# Patient Record
Sex: Male | Born: 1990 | Race: White | Hispanic: No | Smoking: Never smoker
Health system: Southern US, Community
[De-identification: ages and names within clinical notes are randomized; demographics above are authoritative.]

## PROBLEM LIST (undated history)

## (undated) DIAGNOSIS — F909 Attention-deficit hyperactivity disorder, unspecified type: Secondary | ICD-10-CM

## (undated) HISTORY — DX: Attention-deficit hyperactivity disorder, unspecified type: F90.9

---
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
## 2012-02-25 ENCOUNTER — Institutional Professional Consult (permissible substitution): Payer: BC Managed Care – PPO | Admitting: Family

## 2012-02-25 DIAGNOSIS — F909 Attention-deficit hyperactivity disorder, unspecified type: Secondary | ICD-10-CM

## 2012-05-27 ENCOUNTER — Institutional Professional Consult (permissible substitution) (INDEPENDENT_AMBULATORY_CARE_PROVIDER_SITE_OTHER): Payer: BC Managed Care – PPO | Admitting: Family

## 2012-05-27 DIAGNOSIS — F909 Attention-deficit hyperactivity disorder, unspecified type: Secondary | ICD-10-CM

## 2012-08-31 ENCOUNTER — Institutional Professional Consult (permissible substitution) (INDEPENDENT_AMBULATORY_CARE_PROVIDER_SITE_OTHER): Payer: BC Managed Care – PPO | Admitting: Family

## 2012-08-31 DIAGNOSIS — F909 Attention-deficit hyperactivity disorder, unspecified type: Secondary | ICD-10-CM

## 2012-12-01 ENCOUNTER — Institutional Professional Consult (permissible substitution): Payer: BC Managed Care – PPO | Admitting: Family

## 2012-12-01 DIAGNOSIS — F909 Attention-deficit hyperactivity disorder, unspecified type: Secondary | ICD-10-CM

## 2013-02-24 ENCOUNTER — Institutional Professional Consult (permissible substitution): Payer: BC Managed Care – PPO | Admitting: Family

## 2013-02-24 DIAGNOSIS — F909 Attention-deficit hyperactivity disorder, unspecified type: Secondary | ICD-10-CM

## 2013-11-28 ENCOUNTER — Encounter: Payer: BC Managed Care – PPO | Admitting: Family

## 2013-11-28 DIAGNOSIS — F909 Attention-deficit hyperactivity disorder, unspecified type: Secondary | ICD-10-CM

## 2013-12-15 ENCOUNTER — Institutional Professional Consult (permissible substitution): Payer: BC Managed Care – PPO | Admitting: Family

## 2013-12-15 DIAGNOSIS — F909 Attention-deficit hyperactivity disorder, unspecified type: Secondary | ICD-10-CM

## 2014-03-09 ENCOUNTER — Institutional Professional Consult (permissible substitution): Payer: BC Managed Care – PPO | Admitting: Family

## 2014-03-09 DIAGNOSIS — F909 Attention-deficit hyperactivity disorder, unspecified type: Secondary | ICD-10-CM

## 2014-03-15 ENCOUNTER — Institutional Professional Consult (permissible substitution): Payer: BC Managed Care – PPO | Admitting: Family

## 2014-06-01 ENCOUNTER — Institutional Professional Consult (permissible substitution) (INDEPENDENT_AMBULATORY_CARE_PROVIDER_SITE_OTHER): Payer: BC Managed Care – PPO | Admitting: Family

## 2014-06-01 DIAGNOSIS — F909 Attention-deficit hyperactivity disorder, unspecified type: Secondary | ICD-10-CM

## 2014-08-09 ENCOUNTER — Encounter: Payer: Self-pay | Admitting: Family

## 2014-08-09 ENCOUNTER — Ambulatory Visit (INDEPENDENT_AMBULATORY_CARE_PROVIDER_SITE_OTHER): Payer: BC Managed Care – PPO | Admitting: Family

## 2014-08-09 ENCOUNTER — Telehealth: Payer: Self-pay | Admitting: Family

## 2014-08-09 ENCOUNTER — Other Ambulatory Visit (INDEPENDENT_AMBULATORY_CARE_PROVIDER_SITE_OTHER): Payer: BC Managed Care – PPO

## 2014-08-09 VITALS — BP 118/82 | HR 73 | Temp 98.0°F | Resp 18 | Wt 166.2 lb

## 2014-08-09 DIAGNOSIS — Z0000 Encounter for general adult medical examination without abnormal findings: Secondary | ICD-10-CM

## 2014-08-09 DIAGNOSIS — F909 Attention-deficit hyperactivity disorder, unspecified type: Secondary | ICD-10-CM | POA: Diagnosis not present

## 2014-08-09 LAB — LIPID PANEL
Cholesterol: 196 mg/dL (ref 0–200)
HDL: 48.7 mg/dL (ref >39.00)
LDL Cholesterol: 135 mg/dL — ABNORMAL HIGH (ref 0–99)
NONHDL: 147.3
Total CHOL/HDL Ratio: 4
Triglycerides: 60 mg/dL (ref 0.0–149.0)
VLDL: 12 mg/dL (ref 0.0–40.0)

## 2014-08-09 LAB — CBC
HEMATOCRIT: 50.6 % (ref 39.0–52.0)
Hemoglobin: 17.2 g/dL — ABNORMAL HIGH (ref 13.0–17.0)
MCHC: 34 g/dL (ref 30.0–36.0)
MCV: 91.5 fl (ref 78.0–100.0)
Platelets: 276 10*3/uL (ref 150.0–400.0)
RBC: 5.53 Mil/uL (ref 4.22–5.81)
RDW: 12.6 % (ref 11.5–15.5)
WBC: 4.9 10*3/uL (ref 4.0–10.5)

## 2014-08-09 NOTE — Telephone Encounter (Signed)
Please call the patient to inform him that overall his labs were in the normal range with the exception of his LDL cholesterol. The goal is <100 and his is 138. This can be accomplished first with diet and exercise. Increasing fruits, vegetables and fiber in his diet and reducing some of the red meat. Also obtaining about 30 minutes of exercise most days of the week. He can continue to follow up in a year or sooner if needed.

## 2014-08-09 NOTE — Assessment & Plan Note (Signed)
Appears stable at this time. Ad[facility]. Follow up as needed.

## 2014-08-09 NOTE — Assessment & Plan Note (Signed)
Grossly normal exam. Received flu shot today. Will research Gardasil vaccination. Labs drawn. Will follow up in 1 year unless labs suggest otherwise.

## 2014-08-09 NOTE — Progress Notes (Signed)
Subjective:    Patient ID: Edward Randall, male    DOB: 04/26/1991, 23 y.o.   MRN: [id number]  HPI:  Edward Randall is a 23 y.o. male who presents today to establish care and physical.  1) Health Maintance  Health Maintenance: Dental -- Due for exam  Vision -- Due for exam Immunizations -- Flu shot.   2) ADHD - [facility] He has been off and on his concerta for a while and remains off of it. Indicates that he is feeling fine without it.  No Known Allergies  No current outpatient prescriptions on file prior to visit.   No current facility-administered medications on file prior to visit.   Past Medical History  Diagnosis Date  . ADHD (attention deficit hyperactivity disorder)     Review of Systems General: Denies fever, chills, fatigue, or significant weight gain/loss. Skin: Denies rashes, lumps, itching, dryness, color changes, or hair/nail changes HENT:  Head: Denies headache or neck pain  Ears: Denies changes in hearing, ringing in ears, earache, drainage  Eyes: Denies loss/changes in vision, pain, redness, blurry/double vision, flashing  lights  Nose: Denies discharge, stuffiness, itching, nosebleed, sinus pain  Throat: Denies sore throat, hoarseness, dry mouth, sores, thrush Neck: Denies lumps, swollen glands, stiffness Breasts: Denies lumps, pain, discharge Respiratory: Denies shortness of breath, cough, sputum production, wheezing Cardiovascular: Denies chest pain/discomfort, tightness, palpitations, shortness of breath with activity, difficulty lying down, swelling, sudden awakening with shortness of breath Gastrointestinal: Denies dysphasia, heartburn, change in appetite, nausea, change in bowel habits, rectal bleeding, constipation, diarrhea, yellow skin or eyes Urinary: Denies frequency, urgency, burning/pain, blood in urine, incontinence, change in urinary strength. Vascular: Denies calf pain with walking and leg cramping. Musculoskeletal:  Denies muscle/joint pain, stiffness, back pain, redness or swelling of joints, trauma Neurological: Denies dizziness, fainting, seizures, weakness, numbness, tingling, tremor Hematologic - Denies ease of bruising or bleeding Endocrine - Denies heat or cold intolerance, sweating, frequent urination, excessive thirst, changes in appetite Psychiatric - Denies nervousness, stress, depression or memory loss.       Objective:     BP 118/82  Pulse 73  Temp(Src) 98 F (36.7 C) (Oral)  Resp 18  Wt 166 lb 3.2 oz (75.388 kg)  SpO2 96% Nursing note and vital signs reviewed.  Physical Exam  Constitutional: He is oriented to person, place, and time. He appears well-developed and well-nourished. No distress.  HENT:  Head: Normocephalic.  Right Ear: Hearing, tympanic membrane, external ear and ear canal normal.  Left Ear: Hearing, tympanic membrane, external ear and ear canal normal.  Nose: Nose normal.  Mouth/Throat: Uvula is midline, oropharynx is clear and moist and mucous membranes are normal.  Eyes: Conjunctivae and EOM are normal. Pupils are equal, round, and reactive to light.  Neck: Normal range of motion. Neck supple. No JVD present. No tracheal deviation present. No thyromegaly present.  Cardiovascular: Normal rate, regular rhythm and normal heart sounds.   Pulmonary/Chest: Effort normal and breath sounds normal.  Abdominal: Soft. Bowel sounds are normal. He exhibits no distension and no mass. There is no tenderness. There is no rebound and no guarding.  Musculoskeletal: Normal range of motion.  Lymphadenopathy:    He has no cervical adenopathy.  Neurological: He is alert and oriented to person, place, and time.  Skin: Skin is warm and dry.  Psychiatric: He has a normal mood and affect. His behavior is normal. Judgment and thought content normal.  Assessment & Plan:

## 2014-08-09 NOTE — Progress Notes (Signed)
Pre visit review using our clinic review tool, if applicable. No additional management support is needed unless otherwise documented below in the visit note. 

## 2014-08-09 NOTE — Patient Instructions (Signed)
Thank you for choosing [facility].  Summary/Instructions:   Please stop by the lab before leaving for blood work  Please follow up in a year or sooner depending on the blood work results.  Thank you for enrolling in MyChart. Please follow the instructions below to securely access your online medical record. MyChart allows you to send messages to your doctor, view your test results, renew your prescriptions, schedule appointments, and more.  How Do I Sign Up? 1. In your Internet browser, go to [email or web address]. 2. Click on the New  User? link in the Sign In box.  3. Enter your MyChart Access Code exactly as it appears below. You will not need to use this code after you have completed the sign-up process. If you do not sign up before the expiration date, you must request a new code. MyChart Access Code: [email or web address] Expires: 10/08/2014 10:56 AM  4. Enter the last four digits of your Social Security Number (xxxx) and Date of Birth (mm/dd/yyyy) as indicated and click Next. You will be taken to the next sign-up page. 5. Create a MyChart ID. This will be your MyChart login ID and cannot be changed, so think of one that is secure and easy to remember. 6. Create a MyChart password. You can change your password at any time. 7. Enter your Password Reset Question and Answer and click Next. This can be used at a later time if you forget your password.  8. Select your communication preference, and if applicable enter your e-mail address. You will receive e-mail notification when new information is available in MyChart by choosing to receive e-mail notifications and filling in your e-mail. 9. Click Sign In. You can now view your medical record.   Additional Information If you have questions, you can email [email or web address] or call [phone] to talk to our MyChart staff. Remember, MyChart is NOT to be used for urgent needs. For medical emergencies, dial  911.

## 2014-08-10 NOTE — Telephone Encounter (Signed)
Called pt no answer. Left message

## 2014-08-10 NOTE — Telephone Encounter (Signed)
Spoke to pt to let them know labs are normal with the exception of his cholesterol being a little high. Advised him to eat more fruits, veggies, and fiber and to exercise.

## 2014-08-31 ENCOUNTER — Institutional Professional Consult (permissible substitution) (INDEPENDENT_AMBULATORY_CARE_PROVIDER_SITE_OTHER): Payer: BC Managed Care – PPO | Admitting: Family

## 2014-08-31 DIAGNOSIS — F902 Attention-deficit hyperactivity disorder, combined type: Secondary | ICD-10-CM

## 2014-09-22 ENCOUNTER — Emergency Department (HOSPITAL_COMMUNITY): Payer: BC Managed Care – PPO

## 2014-09-22 ENCOUNTER — Emergency Department (HOSPITAL_COMMUNITY)
Admission: EM | Admit: 2014-09-22 | Discharge: 2014-09-22 | Disposition: A | Discharge status: Home / Self Care | Payer: BC Managed Care – PPO | Attending: Emergency Medicine | Admitting: Emergency Medicine

## 2014-09-22 ENCOUNTER — Encounter (HOSPITAL_COMMUNITY): Payer: Self-pay | Admitting: *Deleted

## 2014-09-22 DIAGNOSIS — S51831A Puncture wound without foreign body of right forearm, initial encounter: Secondary | ICD-10-CM | POA: Insufficient documentation

## 2014-09-22 DIAGNOSIS — Y998 Other external cause status: Secondary | ICD-10-CM | POA: Diagnosis not present

## 2014-09-22 DIAGNOSIS — Y9289 Other specified places as the place of occurrence of the external cause: Secondary | ICD-10-CM | POA: Diagnosis not present

## 2014-09-22 DIAGNOSIS — S51832A Puncture wound without foreign body of left forearm, initial encounter: Secondary | ICD-10-CM | POA: Insufficient documentation

## 2014-09-22 DIAGNOSIS — W540XXA Bitten by dog, initial encounter: Secondary | ICD-10-CM | POA: Diagnosis not present

## 2014-09-22 DIAGNOSIS — F909 Attention-deficit hyperactivity disorder, unspecified type: Secondary | ICD-10-CM | POA: Diagnosis not present

## 2014-09-22 DIAGNOSIS — Z23 Encounter for immunization: Secondary | ICD-10-CM | POA: Insufficient documentation

## 2014-09-22 DIAGNOSIS — Y9389 Activity, other specified: Secondary | ICD-10-CM | POA: Diagnosis not present

## 2014-09-22 MED ORDER — AMOXICILLIN-POT CLAVULANATE 875-125 MG PO TABS: 1.0000 | ORAL_TABLET | Freq: Two times a day (BID) | ORAL | Status: DC

## 2014-09-22 MED ORDER — TETANUS-DIPHTH-ACELL PERTUSSIS 5-2.5-18.5 LF-MCG/0.5 IM SUSP
0.5000 mL | Freq: Once | INTRAMUSCULAR | Status: AC
  Administered 2014-09-22: 0.5 mL via INTRAMUSCULAR
  Filled 2014-09-22: qty 0.5

## 2014-09-22 MED ORDER — HYDROCODONE-ACETAMINOPHEN 5-325 MG PO TABS
2.0000 | ORAL_TABLET | Freq: Once | ORAL | Status: AC
  Administered 2014-09-22: 2 via ORAL
  Filled 2014-09-22: qty 2

## 2014-09-22 MED ORDER — HYDROCODONE-ACETAMINOPHEN 5-325 MG PO TABS: 2.0000 | ORAL_TABLET | ORAL | Status: DC | PRN

## 2014-09-22 MED ORDER — IBUPROFEN 400 MG PO TABS
400.0000 mg | ORAL_TABLET | Freq: Once | ORAL | Status: AC
  Administered 2014-09-22: 400 mg via ORAL
  Filled 2014-09-22: qty 1

## 2014-09-22 MED ORDER — BACITRACIN 500 UNIT/GM EX OINT
1.0000 "application " | TOPICAL_OINTMENT | Freq: Two times a day (BID) | CUTANEOUS | Status: DC
  Filled 2014-09-22 (×17): qty 28.4

## 2014-09-22 MED ORDER — NAPROXEN 500 MG PO TABS: 500.0000 mg | ORAL_TABLET | Freq: Two times a day (BID) | ORAL | Status: DC

## 2014-09-22 NOTE — ED Notes (Signed)
Declined W/C at D/C and was escorted to lobby by RN. 

## 2014-09-22 NOTE — ED Notes (Signed)
Pt in stating that his two dogs got into a fight and he was trying to break it up, multiple puncture wounds to bilateral forearms, bleeding controlled, dressing applied in triage, dogs were up to date on vaccinations

## 2014-09-22 NOTE — Discharge Instructions (Signed)
Animal Bite °An animal bite can result in a scratch on the skin, deep open cut, puncture of the skin, crush injury, or tearing away of the skin or a body part. Dogs are responsible for most animal bites. Children are bitten more often than adults. An animal bite can range from very mild to more serious. A small bite from your house pet is no cause for alarm. However, some animal bites can become infected or injure a bone or other tissue. You must seek medical care if: °· The skin is broken and bleeding does not slow down or stop after 15 minutes. °· The puncture is deep and difficult to clean (such as a cat bite). °· Pain, warmth, redness, or pus develops around the wound. °· The bite is from a stray animal or rodent. There may be a risk of rabies infection. °· The bite is from a snake, raccoon, skunk, fox, coyote, or bat. There may be a risk of rabies infection. °· The person bitten has a chronic illness such as diabetes, liver disease, or cancer, or the person takes medicine that lowers the immune system. °· There is concern about the location and severity of the bite. °It is important to clean and protect an animal bite wound right away to prevent infection. Follow these steps: °· Clean the wound with plenty of water and soap. °· Apply an antibiotic cream. °· Apply gentle pressure over the wound with a clean towel or gauze to slow or stop bleeding. °· Elevate the affected area above the heart to help stop any bleeding. °· Seek medical care. Getting medical care within 8 hours of the animal bite leads to the best possible outcome. °DIAGNOSIS  °Your caregiver will most likely: °· Take a detailed history of the animal and the bite injury. °· Perform a wound exam. °· Take your medical history. °Blood tests or X-rays may be performed. Sometimes, infected bite wounds are cultured and sent to a lab to identify the infectious bacteria.  °TREATMENT  °Medical treatment will depend on the location and type of animal bite as  well as the patient's medical history. Treatment may include: °· Wound care, such as cleaning and flushing the wound with saline solution, bandaging, and elevating the affected area. °· Antibiotics. °· Tetanus immunization. °· Rabies immunization. °· Leaving the wound open to heal. This is often done with animal bites, due to the high risk of infection. However, in certain cases, wound closure with stitches, wound adhesive, skin adhesive strips, or staples may be used. ° Infected bites that are left untreated may require intravenous (IV) antibiotics and surgical treatment in the hospital. °HOME CARE INSTRUCTIONS °· Follow your caregiver's instructions for wound care. °· Take all medicines as directed. °· If your caregiver prescribes antibiotics, take them as directed. Finish them even if you start to feel better. °· Follow up with your caregiver for further exams or immunizations as directed. °You may need a tetanus shot if: °· You cannot remember when you had your last tetanus shot. °· You have never had a tetanus shot. °· The injury broke your skin. °If you get a tetanus shot, your arm may swell, get red, and feel warm to the touch. This is common and not a problem. If you need a tetanus shot and you choose not to have one, there is a rare chance of getting tetanus. Sickness from tetanus can be serious. °SEEK MEDICAL CARE IF: °· You notice warmth, redness, soreness, swelling, pus discharge, or a bad   smell coming from the wound.  You have a red line on the skin coming from the wound.  You have a fever, chills, or a general ill feeling.  You have nausea or vomiting.  You have continued or worsening pain.  You have trouble moving the injured part.  You have other questions or concerns. MAKE SURE YOU:  Understand these instructions.  Will watch your condition.  Will get help right away if you are not doing well or get worse. Document Released: 06/30/2011 Document Revised: 01/04/2012 Document  Reviewed: 06/30/2011 [facility] Patient Information 2015 [employer] [place]. This information is not intended to replace advice given to you by your health care provider. Make sure you discuss any questions you have with your health care provider.  You were evaluated in the ED today for your dog bite. Xray showed no fractures or other abnormalities. Your Tdap was updated in the ED. Take all of your antibiotic, Augmentin, as prescribed. Take Naproxen for pain and inflammation, Vicodin for breakthrough pain. Return to ED if you experience fevers, redness, swelling, numbness or weakness.

## 2014-09-22 NOTE — ED Provider Notes (Signed)
CSN: [id number]     Arrival date & time 09/22/14  1306 History   First MD Initiated Contact with Patient 09/22/14 1535     Chief Complaint  Patient presents with  . Animal Bite     (Consider location/radiation/quality/duration/timing/severity/associated sxs/prior Treatment) HPI Edward Randall is a 23 y.o. male with no synechia past medical history who comes in for evaluation after a dog bite. Patient states his dogs got into a fight this afternoon, he tried to separate them and was bitten by his Doberman pinscher. Patient sustained bites to bilateral forearms. Denies any other injury. Patient reports the dog is his and is up-to-date on his shots. Denies any numbness or weakness at this time. Does not know the date of last tetanus.  Past Medical History  Diagnosis Date  . ADHD (attention deficit hyperactivity disorder)    History reviewed. No pertinent past surgical history. Family History  Problem Relation Age of Onset  . Hypertension Mother   . Diabetes Father    History  Substance Use Topics  . Smoking status: Never Smoker   . Smokeless tobacco: Not on file  . Alcohol Use: 2.4 oz/week    4 Cans of beer per week    Review of Systems  Constitutional: Negative for fever.  HENT: Negative for sore throat.   Eyes: Negative for visual disturbance.  Respiratory: Negative for shortness of breath.   Cardiovascular: Negative for chest pain.  Gastrointestinal: Negative for abdominal pain.  Endocrine: Negative for polyuria.  Genitourinary: Negative for dysuria.  Musculoskeletal: Positive for myalgias.  Skin: Negative for rash.  Neurological: Negative for headaches.      Allergies  Review of patient's allergies indicates no known allergies.  Home Medications   Prior to Admission medications   Medication Sig Start Date End Date Taking? Authorizing Provider  amoxicillin-clavulanate (AUGMENTIN) 875-125 MG per tablet Take 1 tablet by mouth every 12 (twelve) hours. 09/22/14    Sharlene MottsBenjamin W Valentina Alcoser, PA-C  HYDROcodone-acetaminophen (NORCO/VICODIN) 5-325 MG per tablet Take 2 tablets by mouth every 4 (four) hours as needed for moderate pain or severe pain. 09/22/14   Earle GellBenjamin W Ellorie Kindall, PA-C  naproxen (NAPROSYN) 500 MG tablet Take 1 tablet (500 mg total) by mouth 2 (two) times daily. 09/22/14   Earle GellBenjamin W Jalexus Brett, PA-C   BP 132/77 mmHg  Pulse 108  Temp(Src) 98.5 F (36.9 C) (Oral)  Resp 20  Ht 5\' 10"  (1.778 m)  Wt 165 lb (74.844 kg)  BMI 23.68 kg/m2  SpO2 100% Physical Exam  Constitutional: He is oriented to person, place, and time. He appears well-developed and well-nourished.  HENT:  Head: Normocephalic and atraumatic.  Mouth/Throat: Oropharynx is clear and moist.  Eyes: Conjunctivae are normal. Pupils are equal, round, and reactive to light. Right eye exhibits no discharge. Left eye exhibits no discharge. No scleral icterus.  Neck: Neck supple.  Cardiovascular: Normal rate, regular rhythm and normal heart sounds.   Pulmonary/Chest: Effort normal and breath sounds normal. No respiratory distress. He has no wheezes. He has no rales.  Abdominal: Soft. There is no tenderness.  Musculoskeletal: He exhibits no tenderness.  Neurological: He is alert and oriented to person, place, and time.  Cranial Nerves II-XII grossly intact  Skin: Skin is warm and dry. No rash noted.  Patient has multiple mild superficial abrasions to bilateral forearms with several lacerations and puncture wounds. Motor and Sensation of both Upper Extremities 5/5. Grip strength equal and intact bil. Neurovascularly intact. Can flex and extend all digits against  resistance.   Psychiatric: He has a normal mood and affect.  Nursing note and vitals reviewed.   ED Course  Procedures (including critical care time) Labs Review Labs Reviewed - No data to display  Imaging Review Dg Forearm Left  09/22/2014   CLINICAL DATA:  Right forearm dog bite and abrasions today.  EXAM: LEFT FOREARM - 2 VIEW   COMPARISON:  None.  FINDINGS: Dorsal soft tissue swelling and small amount of dorsal soft tissue gas or air. No fracture or radiopaque foreign body.  IMPRESSION: Dorsal soft tissue injury without fracture.   Electronically Signed   By: Gordan PaymentSteve  Reid M.D.   On: 09/22/2014 17:44   Dg Forearm Right  09/22/2014   CLINICAL DATA:  Right forearm pain and abrasions following a dog bite injury today.  EXAM: RIGHT FOREARM - 2 VIEW  COMPARISON:  None.  FINDINGS: Dorsal and ventral soft tissue swelling and gas or air. No fracture or radiopaque foreign body.  IMPRESSION: Soft tissue injury without fracture.   Electronically Signed   By: Gordan PaymentSteve  Reid M.D.   On: 09/22/2014 17:45     EKG Interpretation None      MDM  Vitals stable - WNL -afebrile Pt resting comfortably in ED. declines any pain medicine at this time. PE--not concerning for other acute or emergent pathology. Patient maintains 5/5 motor and sensation bilateral upper extremities. He is able to flex and extend against resistance.  Imaging--x-rays of bilateral forearm showed no foreign bodies, fractures, dislocations or other osseous abnormalities.  Bacitracin dressing applied. Tdap updated Will DC with Augmentin, naproxen and Vicodin for breakthrough pain Discussed f/u with PCP and return precautions, pt very amenable to plan. Patient stable, in good condition and is appropriate for discharge   Final diagnoses:  Dog bite        Sharlene MottsBenjamin W Alexine Pilant, PA-C 09/22/14 2047  Mirian MoMatthew Gentry, MD 09/27/14 1650

## 2015-04-18 ENCOUNTER — Telehealth: Payer: Self-pay | Admitting: Family

## 2015-04-18 NOTE — Telephone Encounter (Signed)
Is requesting compass referral for routine eye exam to Dr. Maple Hudson on [facility] (pediatric opthalmology). Office is requesting referral before they make appt.

## 2015-04-25 NOTE — Telephone Encounter (Signed)
Spoke w/pt. He will get this changed. I will check back with [employer] next week.

## 2015-04-25 NOTE — Telephone Encounter (Signed)
Pt must call [employer] and have PCP changed before we can do a referral. Left message for patient to call office.

## 2015-04-25 NOTE — Telephone Encounter (Signed)
Patients mom to call back with diagnosis code [facility].

## 2015-04-26 NOTE — Telephone Encounter (Signed)
[employer] ref # [id number] valid 04/26/15-10/27/15 for 6 visits. Pt is aware.

## 2015-09-11 ENCOUNTER — Ambulatory Visit (INDEPENDENT_AMBULATORY_CARE_PROVIDER_SITE_OTHER): Payer: 59 | Admitting: Family

## 2015-09-11 ENCOUNTER — Encounter: Payer: Self-pay | Admitting: Family

## 2015-09-11 VITALS — BP 134/72 | HR 98 | Temp 98.3°F | Resp 18 | Ht 70.0 in | Wt 149.8 lb

## 2015-09-11 DIAGNOSIS — F909 Attention-deficit hyperactivity disorder, unspecified type: Secondary | ICD-10-CM

## 2015-09-11 DIAGNOSIS — Z0000 Encounter for general adult medical examination without abnormal findings: Secondary | ICD-10-CM | POA: Diagnosis not present

## 2015-09-11 DIAGNOSIS — Z23 Encounter for immunization: Secondary | ICD-10-CM | POA: Diagnosis not present

## 2015-09-11 MED ORDER — METHYLPHENIDATE HCL ER (LA) 10 MG PO CP24: 10.0000 mg | ORAL_CAPSULE | Freq: Every day | ORAL | Status: DC

## 2015-09-11 NOTE — Assessment & Plan Note (Addendum)
1) Anticipatory Guidance: Discussed importance of wearing a seatbelt while driving and not texting while driving; changing batteries in smoke detector at least once annually; wearing suntan lotion when outside; eating a balanced and moderate diet; getting physical activity at least 30 minutes per day.  2) Immunizations / Screenings / Labs:  Flu shot updated today. All other immunizations are up to date per recommendations. Due for a dental screening which will be scheduled independently. All other screenings are up to date per recommendations.  Obtain CBC, BMET, Lipid profile and TSH.   Overall well exam with minimal risk factors for cardiovascular disease noted. Discussed importance of continued healthy lifestyle choices and decreasing the amount of marijuana and encouraging physical activity. He is of good weight. Follow-up prevention exam in 1 year. Follow-up office visit pending blood work as needed.

## 2015-09-11 NOTE — Progress Notes (Signed)
Pre visit review using our clinic review tool, if applicable. No additional management support is needed unless otherwise documented below in the visit note. 

## 2015-09-11 NOTE — Patient Instructions (Addendum)
Thank you for choosing [facility].  Summary/Instructions:  Your prescription(s) have been submitted to your pharmacy or been printed and provided for you. Please take as directed and contact our office if you believe you are having problem(s) with the medication(s) or have any questions.  Please stop by the lab on the basement level of the building for your blood work. Your results will be released to MyChart (or called to you) after review, usually within 72 hours after test completion. If any changes need to be made, you will be notified at that same time.   Health Maintenance, Male A healthy lifestyle and preventative care can promote health and wellness.  Maintain regular health, dental, and eye exams.  Eat a healthy diet. Foods like vegetables, fruits, whole grains, low-fat dairy products, and lean protein foods contain the nutrients you need and are low in calories. Decrease your intake of foods high in solid fats, added sugars, and salt. Get information about a proper diet from your health care provider, if necessary.  Regular physical exercise is one of the most important things you can do for your health. Most adults should get at least 150 minutes of moderate-intensity exercise (any activity that increases your heart rate and causes you to sweat) each week. In addition, most adults need muscle-strengthening exercises on 2 or more days a week.   Maintain a healthy weight. The body mass index (BMI) is a screening tool to identify possible weight problems. It provides an estimate of body fat based on height and weight. Your health care provider can find your BMI and can help you achieve or maintain a healthy weight. For males 20 years and older:  A BMI below 18.5 is considered underweight.  A BMI of 18.5 to 24.9 is normal.  A BMI of 25 to 29.9 is considered overweight.  A BMI of 30 and above is considered obese.  Maintain normal blood lipids and cholesterol by exercising  and minimizing your intake of saturated fat. Eat a balanced diet with plenty of fruits and vegetables. Blood tests for lipids and cholesterol should begin at age 20 and be repeated every 5 years. If your lipid or cholesterol levels are high, you are over age 50, or you are at high risk for heart disease, you may need your cholesterol levels checked more frequently.Ongoing high lipid and cholesterol levels should be treated with medicines if diet and exercise are not working.  If you smoke, find out from your health care provider how to quit. If you do not use tobacco, do not start.  Lung cancer screening is recommended for adults aged 55-80 years who are at high risk for developing lung cancer because of a history of smoking. A yearly low-dose CT scan of the lungs is recommended for people who have at least a 30-pack-year history of smoking and are current smokers or have quit within the past 15 years. A pack year of smoking is smoking an average of 1 pack of cigarettes a day for 1 year (for example, a 30-pack-year history of smoking could mean smoking 1 pack a day for 30 years or 2 packs a day for 15 years). Yearly screening should continue until the smoker has stopped smoking for at least 15 years. Yearly screening should be stopped for people who develop a health problem that would prevent them from having lung cancer treatment.  If you choose to drink alcohol, do not have more than 2 drinks per day. One drink is considered to   be 12 oz (360 mL) of beer, 5 oz (150 mL) of wine, or 1.5 oz (45 mL) of liquor.  Avoid the use of street drugs. Do not share needles with anyone. Ask for help if you need support or instructions about stopping the use of drugs.  High blood pressure causes heart disease and increases the risk of stroke. High blood pressure is more likely to develop in:  People who have blood pressure in the end of the normal range (100-139/85-89 mm Hg).  People who are overweight or  obese.  People who are African American.  If you are 18-39 years of age, have your blood pressure checked every 3-5 years. If you are 40 years of age or older, have your blood pressure checked every year. You should have your blood pressure measured twice--once when you are at a hospital or clinic, and once when you are not at a hospital or clinic. Record the average of the two measurements. To check your blood pressure when you are not at a hospital or clinic, you can use:  An automated blood pressure machine at a pharmacy.  A home blood pressure monitor.  If you are 45-79 years old, ask your health care provider if you should take aspirin to prevent heart disease.  Diabetes screening involves taking a blood sample to check your fasting blood sugar level. This should be done once every 3 years after age 45 if you are at a normal weight and without risk factors for diabetes. Testing should be considered at a younger age or be carried out more frequently if you are overweight and have at least 1 risk factor for diabetes.  Colorectal cancer can be detected and often prevented. Most routine colorectal cancer screening begins at the age of 50 and continues through age 75. However, your health care provider may recommend screening at an earlier age if you have risk factors for colon cancer. On a yearly basis, your health care provider may provide home test kits to check for hidden blood in the stool. A small camera at the end of a tube may be used to directly examine the colon (sigmoidoscopy or colonoscopy) to detect the earliest forms of colorectal cancer. Talk to your health care provider about this at age 50 when routine screening begins. A direct exam of the colon should be repeated every 5-10 years through age 75, unless early forms of precancerous polyps or small growths are found.  People who are at an increased risk for hepatitis B should be screened for this virus. You are considered at high risk  for hepatitis B if:  You were born in a country where hepatitis B occurs often. Talk with your health care provider about which countries are considered high risk.  Your parents were born in a high-risk country and you have not received a shot to protect against hepatitis B (hepatitis B vaccine).  You have HIV or AIDS.  You use needles to inject street drugs.  You live with, or have sex with, someone who has hepatitis B.  You are a man who has sex with other men (MSM).  You get hemodialysis treatment.  You take certain medicines for conditions like cancer, organ transplantation, and autoimmune conditions.  Hepatitis C blood testing is recommended for all people born from 1945 through 1965 and any individual with known risk factors for hepatitis C.  Healthy men should no longer receive prostate-specific antigen (PSA) blood tests as part of routine cancer screening. Talk to   your health care provider about prostate cancer screening.  Testicular cancer screening is not recommended for adolescents or adult males who have no symptoms. Screening includes self-exam, a health care provider exam, and other screening tests. Consult with your health care provider about any symptoms you have or any concerns you have about testicular cancer.  Practice safe sex. Use condoms and avoid high-risk sexual practices to reduce the spread of sexually transmitted infections (STIs).  You should be screened for STIs, including gonorrhea and chlamydia if:  You are sexually active and are younger than 24 years.  You are older than 24 years, and your health care provider tells you that you are at risk for this type of infection.  Your sexual activity has changed since you were last screened, and you are at an increased risk for chlamydia or gonorrhea. Ask your health care provider if you are at risk.  If you are at risk of being infected with HIV, it is recommended that you take a prescription medicine daily to  prevent HIV infection. This is called pre-exposure prophylaxis (PrEP). You are considered at risk if:  You are a man who has sex with other men (MSM).  You are a heterosexual man who is sexually active with multiple partners.  You take drugs by injection.  You are sexually active with a partner who has HIV.  Talk with your health care provider about whether you are at high risk of being infected with HIV. If you choose to begin PrEP, you should first be tested for HIV. You should then be tested every 3 months for as long as you are taking PrEP.  Use sunscreen. Apply sunscreen liberally and repeatedly throughout the day. You should seek shade when your shadow is shorter than you. Protect yourself by wearing long sleeves, pants, a wide-brimmed hat, and sunglasses year round whenever you are outdoors.  Tell your health care provider of new moles or changes in moles, especially if there is a change in shape or color. Also, tell your health care provider if a mole is larger than the size of a pencil eraser.  A one-time screening for abdominal aortic aneurysm (AAA) and surgical repair of large AAAs by ultrasound is recommended for men aged 65-75 years who are current or former smokers.  Stay current with your vaccines (immunizations).   This information is not intended to replace advice given to you by your health care provider. Make sure you discuss any questions you have with your health care provider.   Document Released: 04/09/2008 Document Revised: 11/02/2014 Document Reviewed: 03/09/2011 Elsevier Interactive Patient Education 2016 Elsevier Inc.  

## 2015-09-11 NOTE — Assessment & Plan Note (Signed)
Has been off of the methylphenidate for over 1 year secondary to concern for increased anxiety. Decrease methylphenidate to 10 mg daily. Discussed importance of maintaining medication regimen and if anxiety increases once again, may have to try alternative medications including Strattera or bupropion. [place] controlled substance database reviewed with no irregularities. If chooses to continue, obtain urine drug screen and controlled substance contract.

## 2015-09-11 NOTE — Progress Notes (Signed)
Subjective:    Patient ID: Edward Randall, male    DOB: 21-Jul-1991, 24 y.o.   MRN: [id number]  Chief Complaint  Patient presents with  . CPE    not fasting    HPI:  Edward Randall is a 24 y.o. male who presents today for an annual wellness visit.   1) Health Maintenance -   Diet - Averages about 2-3 meals per day consisting of fruits,vegetables, occasional fish, beef and pork. 1-2 cups of caffeine daily.   Exercise - Physical labor at work - not structured exercise.    2) Preventative Exams / Immunizations:  Dental -- Due for an exam   Vision -- Up to date   Health Maintenance  Topic Date Due  . HIV Screening  03/30/2008  . INFLUENZA VACCINE  05/27/2015  . TETANUS/TDAP  09/22/2024    Immunization History  Administered Date(s) Administered  . Influenza,inj,Quad PF,36+ Mos 09/11/2015  . Tdap 09/22/2014     No Known Allergies   Outpatient Prescriptions Prior to Visit  Medication Sig Dispense Refill  . amoxicillin-clavulanate (AUGMENTIN) 875-125 MG per tablet Take 1 tablet by mouth every 12 (twelve) hours. 14 tablet 0  . HYDROcodone-acetaminophen (NORCO/VICODIN) 5-325 MG per tablet Take 2 tablets by mouth every 4 (four) hours as needed for moderate pain or severe pain. 10 tablet 0  . naproxen (NAPROSYN) 500 MG tablet Take 1 tablet (500 mg total) by mouth 2 (two) times daily. 30 tablet 0   No facility-administered medications prior to visit.     Past Medical History  Diagnosis Date  . ADHD (attention deficit hyperactivity disorder)      History reviewed. No pertinent past surgical history.   Family History  Problem Relation Age of Onset  . Hypertension Mother   . Diabetes Father      Social History   Social History  . Marital Status: Single    Spouse Name: N/A  . Number of Children: 0  . Years of Education: 14   Occupational History  . 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 Smoking status: Never Smoker   .  Smokeless tobacco: Never Used  . Alcohol Use: 2.4 oz/week    4 Cans of beer per week  . Drug Use: 21.00 per week    Special: Marijuana  . Sexual Activity: Yes    Birth Control/ Protection: None   Other Topics Concern  . Not on file   Social History Narrative   Fun: Watch tv, play video games, play the drums   Diet: Eats a lot of meat, 2-3 meals per day; eating more vegetables   Exercise: not any intentional exercise out of work or playing the drums.   Denies any religious beliefs that would effect health care.   Lives in a house with his dad.     Review of Systems  Constitutional: Denies fever, chills, fatigue, or significant weight gain/loss. HENT: Head: Denies headache or neck pain Ears: Denies changes in hearing, ringing in ears, earache, drainage Nose: Denies discharge, stuffiness, itching, nosebleed, sinus pain Throat: Denies sore throat, hoarseness, dry mouth, sores, thrush Eyes: Denies loss/changes in vision, pain, redness, blurry/double vision, flashing lights Cardiovascular: Denies chest pain/discomfort, tightness, palpitations, shortness of breath with activity, difficulty lying down, swelling, sudden awakening with shortness of breath Respiratory: Denies shortness of breath, cough, sputum production, wheezing Gastrointestinal: Denies dysphasia, heartburn, change in appetite, nausea, change in bowel habits, rectal bleeding, constipation, diarrhea,  yellow skin or eyes Genitourinary: Denies frequency, urgency, burning/pain, blood in urine, incontinence, change in urinary strength. Musculoskeletal: Denies muscle/joint pain, stiffness, back pain, redness or swelling of joints, trauma Skin: Denies rashes, lumps, itching, dryness, color changes, or hair/nail changes Neurological: Denies dizziness, fainting, seizures, weakness, numbness, tingling, tremor Psychiatric - Denies nervousness, stress, depression or memory loss Endocrine: Denies heat or cold intolerance, sweating,  frequent urination, excessive thirst, changes in appetite Hematologic: Denies ease of bruising or bleeding     Objective:     BP 134/72 mmHg  Pulse 98  Temp(Src) 98.3 F (36.8 C) (Oral)  Resp 18  Ht 5\' 10"  (1.778 m)  Wt 149 lb 12.8 oz (67.949 kg)  BMI 21.49 kg/m2  SpO2 95% Nursing note and vital signs reviewed.  Physical Exam  Constitutional: He is oriented to person, place, and time. He appears well-developed and well-nourished.  HENT:  Head: Normocephalic.  Right Ear: Hearing, tympanic membrane, external ear and ear canal normal.  Left Ear: Hearing, tympanic membrane, external ear and ear canal normal.  Nose: Nose normal.  Mouth/Throat: Uvula is midline, oropharynx is clear and moist and mucous membranes are normal.  Eyes: Conjunctivae and EOM are normal. Pupils are equal, round, and reactive to light.  Neck: Neck supple. No JVD present. No tracheal deviation present. No thyromegaly present.  Cardiovascular: Normal rate, regular rhythm, normal heart sounds and intact distal pulses.   Pulmonary/Chest: Effort normal and breath sounds normal.  Abdominal: Soft. Bowel sounds are normal. He exhibits no distension and no mass. There is no tenderness. There is no rebound and no guarding.  Musculoskeletal: Normal range of motion. He exhibits no edema or tenderness.  Lymphadenopathy:    He has no cervical adenopathy.  Neurological: He is alert and oriented to person, place, and time. He has normal reflexes. No cranial nerve deficit. He exhibits normal muscle tone. Coordination normal.  Skin: Skin is warm and dry.  Psychiatric: He has a normal mood and affect. His behavior is normal. Judgment and thought content normal.       Assessment & Plan:   Problem List Items Addressed This Visit      Other   Routine general medical examination at a health care facility - Primary    1) Anticipatory Guidance: Discussed importance of wearing a seatbelt while driving and not texting while  driving; changing batteries in smoke detector at least once annually; wearing suntan lotion when outside; eating a balanced and moderate diet; getting physical activity at least 30 minutes per day.  2) Immunizations / Screenings / Labs:  Flu shot updated today. All other immunizations are up to date per recommendations. Due for a dental screening which will be scheduled independently. All other screenings are up to date per recommendations.  Obtain CBC, BMET, Lipid profile and TSH.   Overall well exam with minimal risk factors for cardiovascular disease noted. Discussed importance of continued healthy lifestyle choices and decreasing the amount of marijuana and encouraging physical activity. He is of good weight. Follow-up prevention exam in 1 year. Follow-up office visit pending blood work as needed.      Relevant Orders   CBC   Comprehensive metabolic panel   Lipid panel   TSH   ADHD (attention deficit hyperactivity disorder)    Has been off of the methylphenidate for over 1 year secondary to concern for increased anxiety. Decrease methylphenidate to 10 mg daily. Discussed importance of maintaining medication regimen and if anxiety increases once again, may have to  try alternative medications including Strattera or bupropion. [place] controlled substance database reviewed with no irregularities. If chooses to continue, obtain urine drug screen and controlled substance contract.      Relevant Medications   methylphenidate (RITALIN LA) 10 MG 24 hr capsule    Other Visit Diagnoses    Encounter for immunization

## 2015-09-16 ENCOUNTER — Telehealth: Payer: Self-pay | Admitting: Family

## 2015-09-16 NOTE — Telephone Encounter (Signed)
Please advise 

## 2015-09-16 NOTE — Telephone Encounter (Signed)
Pt called stated that methylphenidate (RITALIN LA) 10 MG is too much trouble to get due to insurance, pt was wondering if Tammy SoursGreg can go a head and give antidepressant (the one that does not need the PA and insurance also cover). Please help

## 2015-09-17 ENCOUNTER — Telehealth: Payer: Self-pay

## 2015-09-17 MED ORDER — BUPROPION HCL ER (SR) 150 MG PO TB12: ORAL_TABLET | ORAL | Status: DC

## 2015-09-17 NOTE — Telephone Encounter (Signed)
Medication sent to pharmacy  

## 2015-09-17 NOTE — Telephone Encounter (Signed)
erx done

## 2015-09-17 NOTE — Telephone Encounter (Signed)
Pt grandmother called to advise that the pharmacy we used no longer accepts their insurance. Requests that we send to [facility]  Address: [address], [place], [place] [address] [phone]

## 2015-09-17 NOTE — Telephone Encounter (Signed)
PA initiated via covermymeds. ZOX:[id number]

## 2015-09-23 ENCOUNTER — Other Ambulatory Visit (INDEPENDENT_AMBULATORY_CARE_PROVIDER_SITE_OTHER): Payer: 59

## 2015-09-23 ENCOUNTER — Telehealth: Payer: Self-pay | Admitting: Family

## 2015-09-23 DIAGNOSIS — Z0000 Encounter for general adult medical examination without abnormal findings: Secondary | ICD-10-CM | POA: Diagnosis not present

## 2015-09-23 LAB — CBC
HEMATOCRIT: 49.6 % (ref 39.0–52.0)
HEMOGLOBIN: 16.7 g/dL (ref 13.0–17.0)
MCHC: 33.6 g/dL (ref 30.0–36.0)
MCV: 92.5 fl (ref 78.0–100.0)
PLATELETS: 265 10*3/uL (ref 150.0–400.0)
RBC: 5.37 Mil/uL (ref 4.22–5.81)
RDW: 12.7 % (ref 11.5–15.5)
WBC: 5.8 10*3/uL (ref 4.0–10.5)

## 2015-09-23 LAB — COMPREHENSIVE METABOLIC PANEL
ALBUMIN: 4.8 g/dL (ref 3.5–5.2)
ALK PHOS: 64 U/L (ref 39–117)
ALT: 12 U/L (ref 0–53)
AST: 12 U/L (ref 0–37)
BUN: 19 mg/dL (ref 6–23)
CO2: 27 mEq/L (ref 19–32)
Calcium: 10.2 mg/dL (ref 8.4–10.5)
Chloride: 102 mEq/L (ref 96–112)
Creatinine, Ser: 0.99 mg/dL (ref 0.40–1.50)
GFR: 100.03 mL/min (ref >60.00)
Glucose, Bld: 97 mg/dL (ref 70–99)
POTASSIUM: 4.4 meq/L (ref 3.5–5.1)
Sodium: 139 mEq/L (ref 135–145)
Total Bilirubin: 0.8 mg/dL (ref 0.2–1.2)
Total Protein: 7.6 g/dL (ref 6.0–8.3)

## 2015-09-23 LAB — LIPID PANEL
Cholesterol: 137 mg/dL (ref 0–200)
HDL: 45 mg/dL (ref >39.00)
LDL Cholesterol: 74 mg/dL (ref 0–99)
NonHDL: 92.25
Total CHOL/HDL Ratio: 3
Triglycerides: 93 mg/dL (ref 0.0–149.0)
VLDL: 18.6 mg/dL (ref 0.0–40.0)

## 2015-09-23 LAB — TSH: TSH: 1.38 u[IU]/mL (ref 0.35–4.50)

## 2015-09-23 NOTE — Telephone Encounter (Signed)
Please inform patient that his blood work shows that his kidney function, liver function, electrolytes, thyroid function, white/red blood cells and cholesterol are within the normal limits. Therefore no further action is required at this time and he can plan to follow up in 1 year.   

## 2015-09-25 NOTE — Telephone Encounter (Signed)
LVM letting pt know.  

## 2015-10-11 ENCOUNTER — Ambulatory Visit (INDEPENDENT_AMBULATORY_CARE_PROVIDER_SITE_OTHER): Payer: 59 | Admitting: Family

## 2015-10-11 ENCOUNTER — Encounter: Payer: Self-pay | Admitting: Family

## 2015-10-11 VITALS — BP 148/80 | HR 83 | Temp 97.8°F | Resp 18 | Ht 70.0 in | Wt 147.0 lb

## 2015-10-11 DIAGNOSIS — F909 Attention-deficit hyperactivity disorder, unspecified type: Secondary | ICD-10-CM

## 2015-10-11 MED ORDER — BUPROPION HCL ER (SR) 150 MG PO TB12: 150.0000 mg | ORAL_TABLET | Freq: Two times a day (BID) | ORAL | Status: DC

## 2015-10-11 NOTE — Assessment & Plan Note (Signed)
ADHD is stable with current regimen of Wellbutrin. Denies adverse side effects. Attention is well controlled. Continue current dosage of Wellbutrin. Follow-up in 3 months.

## 2015-10-11 NOTE — Patient Instructions (Signed)
Thank you for choosing [facility].  Summary/Instructions:  Please continue to take your medication as prescribed.   Your prescription(s) have been submitted to your pharmacy or been printed and provided for you. Please take as directed and contact our office if you believe you are having problem(s) with the medication(s) or have any questions.  If your symptoms worsen or fail to improve, please contact our office for further instruction, or in case of emergency go directly to the emergency room at the closest medical facility.     

## 2015-10-11 NOTE — Progress Notes (Signed)
   Subjective:    Patient ID: Edward Randall, male    DOB: 06/14/1991, 24 y.o.   MRN: [id number]  Chief Complaint  Patient presents with  . Follow-up    does not take ritalin anymore, would like refill of wellbutrin    HPI:  Edward Randall is a 24 y.o. male who  has a past medical history of ADHD (attention deficit hyperactivity disorder). and presents today for a follow up office visit.   1.) ADHD - Currently maintained on wellbutrin. Takes the medication as prescribed and notes that his attention is stable with the current dose. Averaging about 6-7 hours of sleep.   No Known Allergies   No current outpatient prescriptions on file prior to visit.   No current facility-administered medications on file prior to visit.    Review of Systems  Constitutional: Negative for diaphoresis, appetite change and unexpected weight change.  Respiratory: Negative for chest tightness and shortness of breath.   Cardiovascular: Negative for chest pain, palpitations and leg swelling.  Neurological: Negative for headaches.  Psychiatric/Behavioral: Negative for sleep disturbance and decreased concentration. The patient is not nervous/anxious.       Objective:    BP 148/80 mmHg  Pulse 83  Temp(Src) 97.8 F (36.6 C) (Oral)  Resp 18  Ht 5\' 10"  (1.778 m)  Wt 147 lb (66.679 kg)  BMI 21.09 kg/m2  SpO2 98% Nursing note and vital signs reviewed.  Physical Exam  Constitutional: He is oriented to person, place, and time. He appears well-developed and well-nourished. No distress.  Cardiovascular: Normal rate, regular rhythm, normal heart sounds and intact distal pulses.   Pulmonary/Chest: Effort normal and breath sounds normal.  Neurological: He is alert and oriented to person, place, and time.  Skin: Skin is warm and dry.  Psychiatric: He has a normal mood and affect. His behavior is normal. Judgment and thought content normal.       Assessment & Plan:   Problem List Items Addressed This Visit       Other   ADHD (attention deficit hyperactivity disorder) - Primary    ADHD is stable with current regimen of Wellbutrin. Denies adverse side effects. Attention is well controlled. Continue current dosage of Wellbutrin. Follow-up in 3 months.      Relevant Medications   buPROPion (WELLBUTRIN SR) 150 MG 12 hr tablet

## 2015-10-11 NOTE — Progress Notes (Signed)
Pre visit review using our clinic review tool, if applicable. No additional management support is needed unless otherwise documented below in the visit note. 

## 2015-12-25 ENCOUNTER — Encounter: Payer: Self-pay | Admitting: Family

## 2015-12-25 ENCOUNTER — Ambulatory Visit (INDEPENDENT_AMBULATORY_CARE_PROVIDER_SITE_OTHER): Payer: BLUE CROSS/BLUE SHIELD | Admitting: Family

## 2015-12-25 VITALS — BP 116/70 | HR 76 | Temp 98.1°F | Resp 14 | Ht 70.0 in | Wt 142.4 lb

## 2015-12-25 DIAGNOSIS — F909 Attention-deficit hyperactivity disorder, unspecified type: Secondary | ICD-10-CM

## 2015-12-25 MED ORDER — BUPROPION HCL ER (SR) 150 MG PO TB12: 150.0000 mg | ORAL_TABLET | Freq: Two times a day (BID) | ORAL | Status: DC

## 2015-12-25 NOTE — Patient Instructions (Signed)
Thank you for choosing [facility].  Summary/Instructions:  Continue to take the medication as prescribed.   Your prescription(s) have been submitted to your pharmacy or been printed and provided for you. Please take as directed and contact our office if you believe you are having problem(s) with the medication(s) or have any questions.  If your symptoms worsen or fail to improve, please contact our office for further instruction, or in case of emergency go directly to the emergency room at the closest medical facility.

## 2015-12-25 NOTE — Assessment & Plan Note (Signed)
ADHD symptoms well controlled with current regimen and denies adverse side effects. Continue current dosage of Wellbutrin. Follow-up in 6 months or sooner if needed.

## 2015-12-25 NOTE — Progress Notes (Signed)
Pre visit review using our clinic review tool, if applicable. No additional management support is needed unless otherwise documented below in the visit note. 

## 2015-12-25 NOTE — Progress Notes (Signed)
   Subjective:    Patient ID: Edward Randall, male    DOB: 1991/02/04, 25 y.o.   MRN: [id number]  Chief Complaint  Patient presents with  . Medication Management    med refill    HPI:  Edward Randall is a 25 y.o. male who  has a past medical history of ADHD (attention deficit hyperactivity disorder). and presents today for a follow up office visit.   1.) ADHD - Currently maintained on Wellbutrin. Reports taking the medication as prescribed and denies adverse side effects. Notes that his attention is adequately controlled with current regimen. Reports that he sleeping well and has no significant changes in appetite or weight loss.   No Known Allergies   Outpatient Prescriptions Prior to Visit  Medication Sig Dispense Refill  . buPROPion (WELLBUTRIN SR) 150 MG 12 hr tablet Take 1 tablet (150 mg total) by mouth 2 (two) times daily. 60 tablet 2   No facility-administered medications prior to visit.     Review of Systems  Constitutional: Negative for fever, chills and unexpected weight change.  Psychiatric/Behavioral: Negative for sleep disturbance and decreased concentration.      Objective:    BP 116/70 mmHg  Pulse 76  Temp(Src) 98.1 F (36.7 C) (Oral)  Resp 14  Ht  (1.778 m)  Wt 142 lb 6.4 oz (64.592 kg)  BMI 20.43 kg/m2  SpO2 97% Nursing note and vital signs reviewed.  Physical Exam  Constitutional: He is oriented to person, place, and time. He appears well-developed and well-nourished. No distress.  Cardiovascular: Normal rate, regular rhythm, normal heart sounds and intact distal pulses.   Pulmonary/Chest: Effort normal and breath sounds normal.  Neurological: He is alert and oriented to person, place, and time.  Skin: Skin is warm and dry.  Psychiatric: He has a normal mood and affect. His behavior is normal. Judgment and thought content normal.       Assessment & Plan:   Problem List Items Addressed This Visit      Other   ADHD (attention deficit  hyperactivity disorder) - Primary    ADHD symptoms well controlled with current regimen and denies adverse side effects. Continue current dosage of Wellbutrin. Follow-up in 6 months or sooner if needed.      Relevant Medications   buPROPion (WELLBUTRIN SR) 150 MG 12 hr tablet

## 2016-02-06 DIAGNOSIS — S39011A Strain of muscle, fascia and tendon of abdomen, initial encounter: Secondary | ICD-10-CM | POA: Diagnosis not present

## 2016-04-02 ENCOUNTER — Other Ambulatory Visit: Payer: Self-pay | Admitting: Family

## 2016-09-04 ENCOUNTER — Other Ambulatory Visit: Payer: Self-pay | Admitting: Family

## 2016-09-11 DIAGNOSIS — S92301A Fracture of unspecified metatarsal bone(s), right foot, initial encounter for closed fracture: Secondary | ICD-10-CM | POA: Diagnosis not present

## 2016-09-12 DIAGNOSIS — S92354A Nondisplaced fracture of fifth metatarsal bone, right foot, initial encounter for closed fracture: Secondary | ICD-10-CM | POA: Diagnosis not present

## 2017-04-07 ENCOUNTER — Other Ambulatory Visit: Payer: Self-pay | Admitting: Family

## 2017-04-09 ENCOUNTER — Emergency Department (HOSPITAL_COMMUNITY): Payer: BLUE CROSS/BLUE SHIELD

## 2017-04-09 ENCOUNTER — Encounter (HOSPITAL_COMMUNITY): Payer: Self-pay | Admitting: *Deleted

## 2017-04-09 ENCOUNTER — Emergency Department (HOSPITAL_COMMUNITY)
Admission: EM | Admit: 2017-04-09 | Discharge: 2017-04-09 | Disposition: A | Discharge status: Home / Self Care | Payer: BLUE CROSS/BLUE SHIELD | Attending: Emergency Medicine | Admitting: Emergency Medicine

## 2017-04-09 DIAGNOSIS — R0989 Other specified symptoms and signs involving the circulatory and respiratory systems: Secondary | ICD-10-CM | POA: Diagnosis not present

## 2017-04-09 DIAGNOSIS — H8111 Benign paroxysmal vertigo, right ear: Secondary | ICD-10-CM | POA: Diagnosis not present

## 2017-04-09 DIAGNOSIS — R42 Dizziness and giddiness: Secondary | ICD-10-CM | POA: Diagnosis not present

## 2017-04-09 DIAGNOSIS — H8110 Benign paroxysmal vertigo, unspecified ear: Secondary | ICD-10-CM | POA: Diagnosis not present

## 2017-04-09 DIAGNOSIS — H8149 Vertigo of central origin, unspecified ear: Secondary | ICD-10-CM | POA: Diagnosis not present

## 2017-04-09 LAB — CBC WITH DIFFERENTIAL/PLATELET
Basophils Absolute: 0 10*3/uL (ref 0.0–0.1)
Basophils Relative: 0 %
Eosinophils Absolute: 0.2 10*3/uL (ref 0.0–0.7)
Eosinophils Relative: 2 %
HEMATOCRIT: 44.6 % (ref 39.0–52.0)
HEMOGLOBIN: 15.3 g/dL (ref 13.0–17.0)
Lymphocytes Relative: 35 %
Lymphs Abs: 2.5 10*3/uL (ref 0.7–4.0)
MCH: 31.3 pg (ref 26.0–34.0)
MCHC: 34.3 g/dL (ref 30.0–36.0)
MCV: 91.2 fL (ref 78.0–100.0)
Monocytes Absolute: 0.4 10*3/uL (ref 0.1–1.0)
Monocytes Relative: 6 %
NEUTROS ABS: 4 10*3/uL (ref 1.7–7.7)
NEUTROS PCT: 57 %
Platelets: 239 10*3/uL (ref 150–400)
RBC: 4.89 MIL/uL (ref 4.22–5.81)
RDW: 12.1 % (ref 11.5–15.5)
WBC: 7.1 10*3/uL (ref 4.0–10.5)

## 2017-04-09 LAB — BASIC METABOLIC PANEL
Anion gap: 8 (ref 5–15)
BUN: 16 mg/dL (ref 6–20)
CALCIUM: 9.7 mg/dL (ref 8.9–10.3)
CHLORIDE: 104 mmol/L (ref 101–111)
CO2: 27 mmol/L (ref 22–32)
Creatinine, Ser: 0.88 mg/dL (ref 0.61–1.24)
GFR calc non Af Amer: >60 mL/min (ref >60)
GLUCOSE: 98 mg/dL (ref 65–99)
POTASSIUM: 4.2 mmol/L (ref 3.5–5.1)
Sodium: 139 mmol/L (ref 135–145)

## 2017-04-09 MED ORDER — IOPAMIDOL (ISOVUE-370) INJECTION 76%
INTRAVENOUS | Status: AC
  Administered 2017-04-09: 50 mL
  Filled 2017-04-09: qty 50

## 2017-04-09 NOTE — ED Provider Notes (Signed)
[facility]-EMERGENCY DEPT Provider Note   CSN: [id number] Arrival date & time: 04/09/17  1614     History   Chief Complaint Chief Complaint  Patient presents with  . Dizziness    HPI Edward Randall is a 26 y.o. male.  HPI 26 year old male with no prior past medical history presents to the ED after being evaluated at urgent care for vertiginous symptoms. Patient reports that his symptoms began this morning. This is a new symptom for him. Reports several episodes, occurring since initial onset. Reports that his symptoms are exacerbated with position changes and head movement. No other alleviating or aggravating factors. He denied any recent fevers, illnesses, infections. Patient denied any other physical complaints.   He was seen at urgent care and given Antivert which completely resolved his symptoms. During that time patient had an ultrasound of his neck which revealed a left carotid artery defect most consistent with artifact but given his symptoms was suspicious for dissection flap. Patient was then referred to the emergency department for further workup.  Currently patient is complaining of right-sided neck pain which began several hours after the vertiginous symptoms began. Neck pain is exacerbated with palpation of the right para spinal muscles and neck movement. Denies any left-sided neck pain.  Past Medical History:  Diagnosis Date  . ADHD (attention deficit hyperactivity disorder)     Patient Active Problem List   Diagnosis Date Noted  . Routine general medical examination at a health care facility 08/09/2014  . ADHD (attention deficit hyperactivity disorder) 08/09/2014    History reviewed. No pertinent surgical history.     Home Medications    Prior to Admission medications   Medication Sig Start Date End Date Taking? Authorizing Provider  meclizine (ANTIVERT) 25 MG tablet Take 25 mg by mouth 3 (three) times daily as needed for dizziness.   Yes [provider]   buPROPion (WELLBUTRIN SR) 150 MG 12 hr tablet TAKE ONE TABLET BY MOUTH TWICE DAILY Patient not taking: Reported on 04/09/2017 09/04/16   Veryl Speak, FNP    Family History Family History  Problem Relation Age of Onset  . Hypertension Mother   . Bipolar disorder Mother   . Diabetes Father   . Colon cancer Maternal Grandfather   . Fibromyalgia Paternal Grandmother   . Prostate cancer Paternal Grandfather     Social History Social History  Substance Use Topics  . Smoking status: Never Smoker  . Smokeless tobacco: Never Used  . Alcohol use 2.4 oz/week    4 Cans of beer per week     Allergies   Patient has no known allergies.   Review of Systems Review of Systems All other systems are reviewed and are negative for acute change except as noted in the HPI   Physical Exam Updated Vital Signs BP 134/84   Pulse 79   Temp 98.2 F (36.8 C)   Resp 19   Ht 5\' 10"  (1.778 m)   Wt 63.2 kg (139 lb 6 oz)   SpO2 100%   BMI 20.00 kg/m   Physical Exam  Constitutional: He is oriented to person, place, and time. He appears well-developed and well-nourished. No distress.  HENT:  Head: Normocephalic and atraumatic.  Nose: Nose normal.  Eyes: Conjunctivae and EOM are normal. Pupils are equal, round, and reactive to light. Right eye exhibits no discharge. Left eye exhibits no discharge. No scleral icterus.  Neck: Normal range of motion. Neck supple. Normal carotid pulses and no JVD present. Carotid  bruit is not present.  Cardiovascular: Normal rate and regular rhythm.  Exam reveals no gallop and no friction rub.   No murmur heard. Pulmonary/Chest: Effort normal and breath sounds normal. No stridor. No respiratory distress. He has no rales.  Abdominal: Soft. He exhibits no distension. There is no tenderness.  Musculoskeletal: He exhibits no edema or tenderness.  Neurological: He is alert and oriented to person, place, and time.  Mental Status: Alert and oriented to person,  place, and time. Attention and concentration normal. Speech clear. Recent memory is intact  Cranial Nerves  II Visual Fields: Intact to confrontation. Visual fields intact. III, IV, VI: Pupils equal and reactive to light and near. Full eye movement with lateral nystagmus  V Facial Sensation: Normal. No weakness of masticatory muscles  VII: No facial weakness or asymmetry  VIII Auditory Acuity: Grossly normal  IX/X: The uvula is midline; the palate elevates symmetrically  XI: Normal sternocleidomastoid and trapezius strength  XII: The tongue is midline. No atrophy or fasciculations.   Motor System: Muscle Strength: 5/5 and symmetric in the upper and lower extremities. No pronation or drift.  Muscle Tone: Tone and muscle bulk are normal in the upper and lower extremities.   Reflexes: DTRs: 1+ and symmetrical in all four extremities. Plantar responses are flexor bilaterally.  Coordination: Intact finger-to-nose, heel-to-shin. No tremor.  Sensation: Intact to light touch, and pinprick. Negative Romberg test.  Gait: Routine and tandem gait normal.   HINTS: Nystagmus: bilateral nystagmus; most prominent to the left Head impulse. Abnormal bilaterally Skew: normal   Skin: Skin is warm and dry. No rash noted. He is not diaphoretic. No erythema.  Psychiatric: He has a normal mood and affect.  Vitals reviewed.    ED Treatments / Results  Labs (all labs ordered are listed, but only abnormal results are displayed) Labs Reviewed  CBC WITH DIFFERENTIAL/PLATELET  BASIC METABOLIC PANEL    EKG  EKG Interpretation None       Radiology Ct Angio Neck W And/or Wo Contrast  Result Date: 04/09/2017 CLINICAL DATA:  Initial evaluation for acute vertigo, concern for possible dissection flap in left neck on prior ultrasound. EXAM: CT ANGIOGRAPHY NECK TECHNIQUE: Multidetector CT imaging of the neck was performed using the standard protocol during bolus administration of intravenous contrast.  Multiplanar CT image reconstructions and MIPs were obtained to evaluate the vascular anatomy. Carotid stenosis measurements (when applicable) are obtained utilizing NASCET criteria, using the distal internal carotid diameter as the denominator. CONTRAST:  50 cc of Isovue 370. COMPARISON:  None available. FINDINGS: Aortic arch: Visualized aortic arch is normal in caliber and appearance with normal 3 vessel morphology. No flow-limiting stenosis about the origin of the great vessels. Visualized subclavian arteries widely patent. Right carotid system: Right common and internal carotid arteries are widely patent without stenosis, dissection, intraluminal thrombus, or occlusion. No significant atheromatous narrowing about the right carotid bifurcation. Right external carotid artery and its branches within normal limits. Left carotid system: Left common and internal carotid artery's widely patent without stenosis, dissection, intraluminal thrombus, or occlusion. No significant atheromatous narrowing about the left carotid bifurcation. Left external carotid artery and its branches within normal limits. Vertebral arteries: Both of the vertebral arteries arise from the subclavian arteries. Vertebral arteries widely patent without stenosis, dissection, or occlusion. Skeleton: No acute osseus abnormality. No worrisome lytic or blastic osseous lesions. Other neck: No acute soft tissue abnormality within the neck. Salivary glands normal. No adenopathy. Thyroid normal. Upper chest: Visualized upper chest within  normal limits. Visualized lungs are clear. IMPRESSION: Normal CTA of the neck. No evidence for dissection or other acute vascular abnormality. Electronically Signed   By: Rise Mu M.D.   On: 04/09/2017 19:35    Procedures Procedures (including critical care time)  Medications Ordered in ED Medications  iopamidol (ISOVUE-370) 76 % injection (50 mLs  Contrast Given 04/09/17 1914)     Initial Impression  / Assessment and Plan / ED Course  I have reviewed the triage vital signs and the nursing notes.  Pertinent labs & imaging results that were available during my care of the patient were reviewed by me and considered in my medical decision making (see chart for details).     Vertigo. Neurologic exam nonfocal. Hints exam reassuring for peripheral process. Low suspicion for central process. However given the ultrasound findings are concerning for possible dissection, we'll obtain a CTA.   CTA without evidence of dissection.  Patient remained asymptomatic throughout his stay.  The patient is safe for discharge with strict return precautions.  Final Clinical Impressions(s) / ED Diagnoses   Final diagnoses:  Vertigo   Disposition: Discharge  Condition: Good  I have discussed the results, Dx and Tx plan with the patient who expressed understanding and agree(s) with the plan. Discharge instructions discussed at great length. The patient was given strict return precautions who verbalized understanding of the instructions. No further questions at time of discharge.    New Prescriptions   No medications on file    Follow Up: Veryl Speak, FNP [address] [place] [address] [phone]  Schedule an appointment as soon as possible for a visit  As needed      Nira Conn, MD 04/09/17 2035

## 2017-04-09 NOTE — ED Triage Notes (Signed)
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 Pt was seen by PCP and sent to [facility]. US shows concern for possible dissection flaps and they requested a r/u CTA of the neck.

## 2017-04-15 ENCOUNTER — Ambulatory Visit (INDEPENDENT_AMBULATORY_CARE_PROVIDER_SITE_OTHER): Payer: BLUE CROSS/BLUE SHIELD | Admitting: Family

## 2017-04-15 ENCOUNTER — Encounter: Payer: Self-pay | Admitting: Family

## 2017-04-15 DIAGNOSIS — R42 Dizziness and giddiness: Secondary | ICD-10-CM | POA: Insufficient documentation

## 2017-04-15 NOTE — Progress Notes (Signed)
Subjective:    Patient ID: Edward Randall, male    DOB: 10/15/1991, 26 y.o.   MRCarmie Kanner: [id number]  Chief Complaint  Patient presents with  . Hospitalization Follow-up    vertigo    HPI:  Edward Randall is a 26 y.o. male who  has a past medical history of ADHD (attention deficit hyperactivity disorder). and presents today for a follow up office visit.   Recently evaluated in the emergency department for symptoms of vertigo. Symptoms were exacerbated with position change in head movement. Treated in urgent care and given Antivert which completely resolved his symptoms. A carotid ultrasound was performed with concern for a carotid artery defect likely artifact but concern remains for possible dissection. CT scan without evidence of dissection. Diagnosed with vertigo and given instructions for follow-up. ED records, labs, and imaging reviewed in detail.  Since leaving the ED he reports 1 additional episode of dizziness. Symptoms are well controlled with the meclizine as he is taking up to 3 times per day. No adverse side effects.  Drinking plenty of fluids. Has had previous episodes in the past.  No Known Allergies    Outpatient Medications Prior to Visit  Medication Sig Dispense Refill  . meclizine (ANTIVERT) 25 MG tablet Take 25 mg by mouth 3 (three) times daily as needed for dizziness.    Marland Kitchen. buPROPion (WELLBUTRIN SR) 150 MG 12 hr tablet TAKE ONE TABLET BY MOUTH TWICE DAILY (Patient not taking: Reported on 04/09/2017) 180 tablet 0   No facility-administered medications prior to visit.       No past surgical history on file.    Past Medical History:  Diagnosis Date  . ADHD (attention deficit hyperactivity disorder)       Review of Systems  Constitutional: Negative for chills and fever.  Respiratory: Negative for chest tightness and shortness of breath.   Cardiovascular: Negative for chest pain, palpitations and leg swelling.  Neurological: Positive for dizziness (Occasional).  Negative for syncope and weakness.      Objective:    BP (!) 110/58 (BP Location: Left Arm, Patient Position: Sitting, Cuff Size: Normal)   Pulse 95   Temp 98.1 F (36.7 C) (Oral)   Ht 5\' 10"  (1.778 m)   Wt 138 lb (62.6 kg)   SpO2 98%   BMI 19.80 kg/m  Nursing note and vital signs reviewed.  Physical Exam  Constitutional: He is oriented to person, place, and time. He appears well-developed and well-nourished. No distress.  HENT:  Right Ear: Hearing, tympanic membrane, external ear and ear canal normal.  Left Ear: Hearing, tympanic membrane, external ear and ear canal normal.  Mouth/Throat: Uvula is midline, oropharynx is clear and moist and mucous membranes are normal.  Eyes: Conjunctivae and EOM are normal. Pupils are equal, round, and reactive to light.  Neck: Neck supple.  Cardiovascular: Normal rate, regular rhythm, normal heart sounds and intact distal pulses.   Pulmonary/Chest: Effort normal and breath sounds normal. No respiratory distress. He has no wheezes. He has no rales. He exhibits no tenderness.  Lymphadenopathy:    He has no cervical adenopathy.  Neurological: He is alert and oriented to person, place, and time. No cranial nerve deficit.  Skin: Skin is warm and dry.  Psychiatric: He has a normal mood and affect. His behavior is normal. Judgment and thought content normal.       Assessment & Plan:   Problem List Items Addressed This Visit      Other   Vertigo  Symptoms and exam consistent with vertigo adequately controlled with meclizine. Continue meclizine. Consider physical therapy if symptoms worsen or do not improve. Neurological exam normal today. Follow-up as needed.          I have discontinued Mr. Winsett buPROPion. I am also having him maintain his meclizine.   Follow-up: Return if symptoms worsen or fail to improve.  Jeanine Luz, FNP

## 2017-04-15 NOTE — Patient Instructions (Signed)
Thank you for choosing [facility].  SUMMARY AND INSTRUCTIONS:  Continue to take the meclizine as needed. Work to wean off medication.  Continue to drink plenty of fluids.   Follow up as needed.   Follow up:  If your symptoms worsen or fail to improve, please contact our office for further instruction, or in case of emergency go directly to the emergency room at the closest medical facility.     Benign Positional Vertigo Vertigo is the feeling that you or your surroundings are moving when they are not. Benign positional vertigo is the most common form of vertigo. The cause of this condition is not serious (is benign). This condition is triggered by certain movements and positions (is positional). This condition can be dangerous if it occurs while you are doing something that could endanger you or others, such as driving. What are the causes? In many cases, the cause of this condition is not known. It may be caused by a disturbance in an area of the inner ear that helps your brain to sense movement and balance. This disturbance can be caused by a viral infection (labyrinthitis), head injury, or repetitive motion. What increases the risk? This condition is more likely to develop in:  Women.  People who are 84 years of age or older.  What are the signs or symptoms? Symptoms of this condition usually happen when you move your head or your eyes in different directions. Symptoms may start suddenly, and they usually last for less than a minute. Symptoms may include:  Loss of balance and falling.  Feeling like you are spinning or moving.  Feeling like your surroundings are spinning or moving.  Nausea and vomiting.  Blurred vision.  Dizziness.  Involuntary eye movement (nystagmus).  Symptoms can be mild and cause only slight annoyance, or they can be severe and interfere with daily life. Episodes of benign positional vertigo may return (recur) over time, and they may be  triggered by certain movements. Symptoms may improve over time. How is this diagnosed? This condition is usually diagnosed by medical history and a physical exam of the head, neck, and ears. You may be referred to a health care provider who specializes in ear, nose, and throat (ENT) problems (otolaryngologist) or a provider who specializes in disorders of the nervous system (neurologist). You may have additional testing, including:  MRI.  A CT scan.  Eye movement tests. Your health care provider may ask you to change positions quickly while he or she watches you for symptoms of benign positional vertigo, such as nystagmus. Eye movement may be tested with an electronystagmogram (ENG), caloric stimulation, the Dix-Hallpike test, or the roll test.  An electroencephalogram (EEG). This records electrical activity in your brain.  Hearing tests.  How is this treated? Usually, your health care provider will treat this by moving your head in specific positions to adjust your inner ear back to normal. Surgery may be needed in severe cases, but this is rare. In some cases, benign positional vertigo may resolve on its own in 2-4 weeks. Follow these instructions at home: Safety  Move slowly.Avoid sudden body or head movements.  Avoid driving.  Avoid operating heavy machinery.  Avoid doing any tasks that would be dangerous to you or others if a vertigo episode would occur.  If you have trouble walking or keeping your balance, try using a cane for stability. If you feel dizzy or unstable, sit down right away.  Return to your normal activities as told  by your health care provider. Ask your health care provider what activities are safe for you. General instructions  Take over-the-counter and prescription medicines only as told by your health care provider.  Avoid certain positions or movements as told by your health care provider.  Drink enough fluid to keep your urine clear or pale  yellow.  Keep all follow-up visits as told by your health care provider. This is important. Contact a health care provider if:  You have a fever.  Your condition gets worse or you develop new symptoms.  Your family or friends notice any behavioral changes.  Your nausea or vomiting gets worse.  You have numbness or a "pins and needles" sensation. Get help right away if:  You have difficulty speaking or moving.  You are always dizzy.  You faint.  You develop severe headaches.  You have weakness in your legs or arms.  You have changes in your hearing or vision.  You develop a stiff neck.  You develop sensitivity to light. This information is not intended to replace advice given to you by your health care provider. Make sure you discuss any questions you have with your health care provider. Document Released: 07/20/2006 Document Revised: 03/19/2016 Document Reviewed: 02/04/2015 Elsevier Interactive Patient Education  [employer]2018 [employer]

## 2017-04-15 NOTE — Assessment & Plan Note (Signed)
Symptoms and exam consistent with vertigo adequately controlled with meclizine. Continue meclizine. Consider physical therapy if symptoms worsen or do not improve. Neurological exam normal today. Follow-up as needed.

## 2017-04-26 ENCOUNTER — Other Ambulatory Visit: Payer: Self-pay | Admitting: Family

## 2017-11-02 ENCOUNTER — Other Ambulatory Visit: Payer: Self-pay | Admitting: *Deleted

## 2018-04-26 DIAGNOSIS — S339XXA Sprain of unspecified parts of lumbar spine and pelvis, initial encounter: Secondary | ICD-10-CM | POA: Diagnosis not present

## 2019-05-11 IMAGING — CT CT ANGIO NECK
2 of 7 series · 8 of 33 positions shown · IV contrast (isovue)
Comparison: None available.

CLINICAL DATA: Initial evaluation for acute vertigo, concern for
possible dissection flap in left neck on prior ultrasound.

EXAM:
CT ANGIOGRAPHY NECK
TECHNIQUE: Multidetector CT imaging of the neck was performed using the
standard protocol during bolus administration of intravenous
contrast. Multiplanar CT image reconstructions and MIPs were
obtained to evaluate the vascular anatomy. Carotid stenosis
measurements (when applicable) are obtained utilizing NASCET
criteria, using the distal internal carotid diameter as the
denominator.
CONTRAST:  50 cc of Isovue 370.

[Series 5: cta neck · axial · 0.50mm/px · z∈[-176,-90]mm · 2 of 131 slices shown]
[im 44/131  soft-tissue]
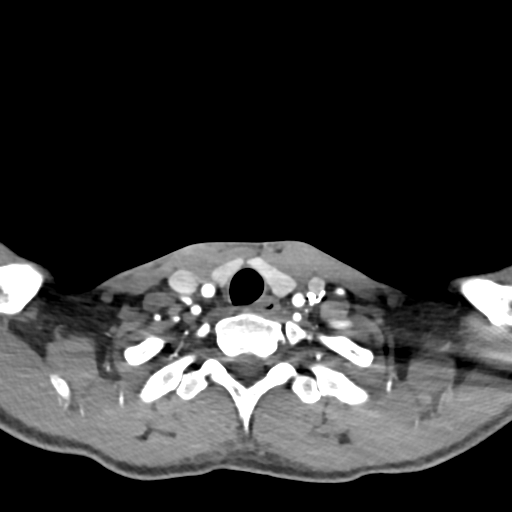
[im 87/131  soft-tissue]
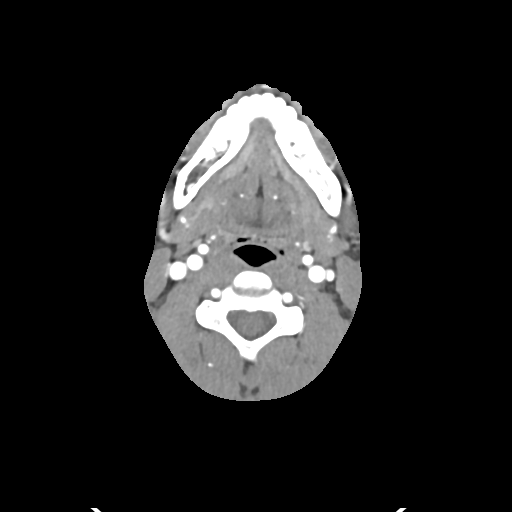

[Series 7: cta neck axial · axial · 0.39mm/px · z∈[-224,-39]mm · 6 of 261 slices shown]
[im 38/261  soft-tissue]
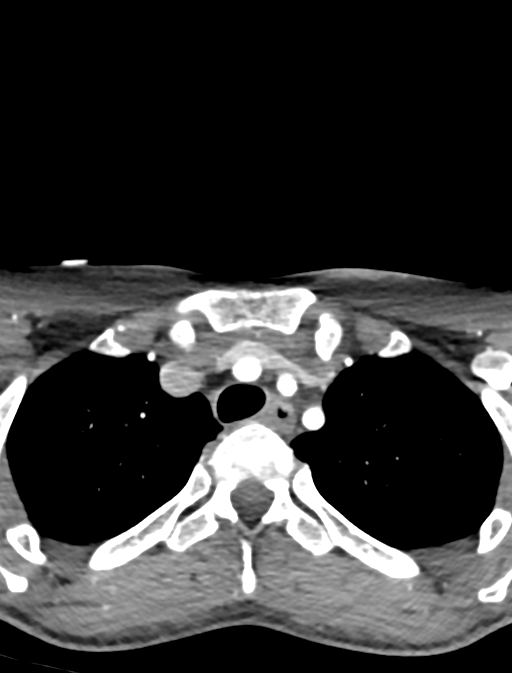
[im 75/261  bone]
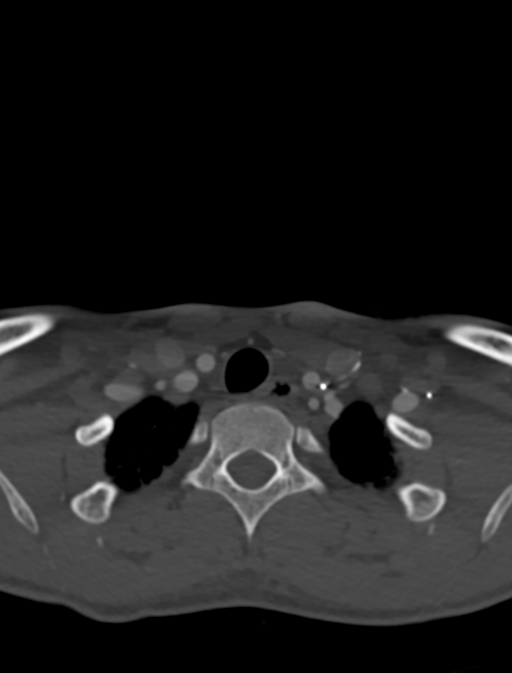
[im 112/261  soft-tissue]
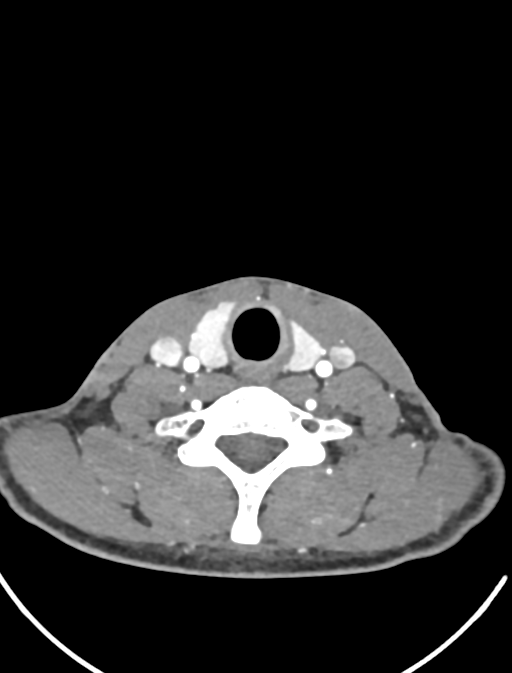
[im 149/261  bone]
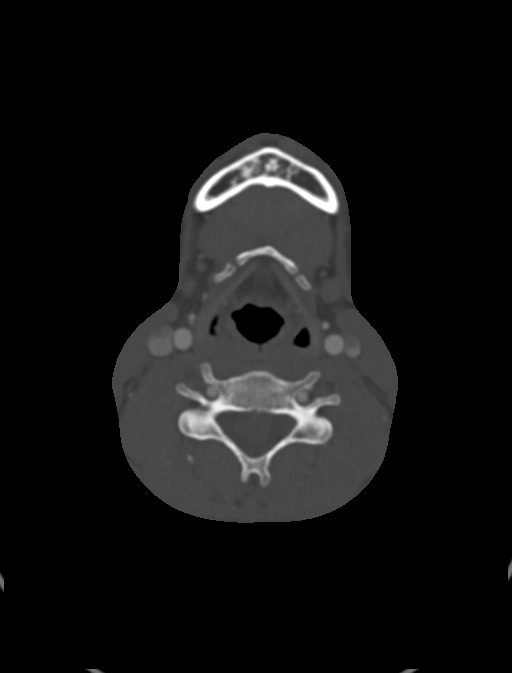
[im 186/261  soft-tissue]
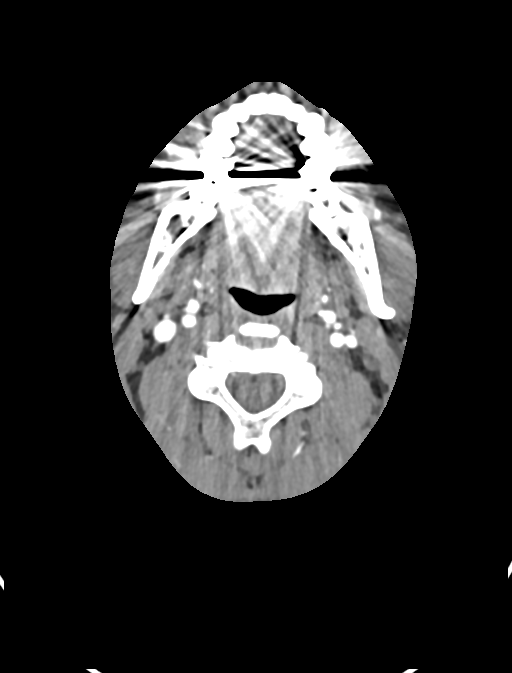
[im 223/261  bone]
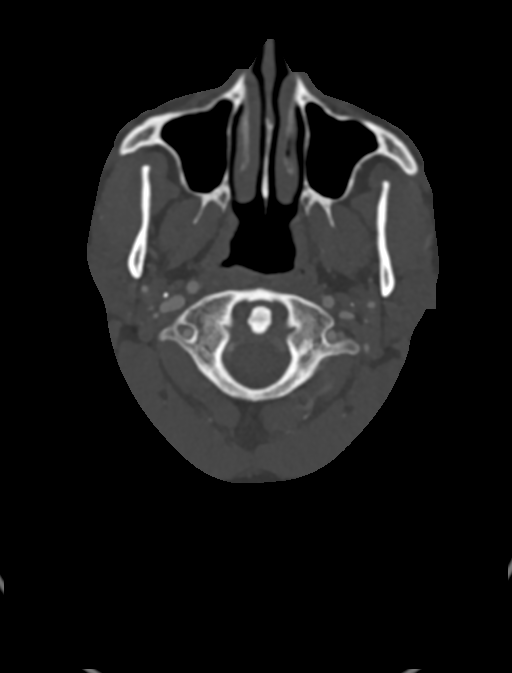

[8 of 33 positions shown; findings below may reference images not displayed]

FINDINGS: Aortic arch: Visualized aortic arch is normal in caliber and
appearance with normal 3 vessel morphology. No flow-limiting
stenosis about the origin of the great vessels. Visualized
subclavian arteries widely patent.

Right carotid system: Right common and internal carotid arteries are
widely patent without stenosis, dissection, intraluminal thrombus,
or occlusion. No significant atheromatous narrowing about the right
carotid bifurcation. Right external carotid artery and its branches
within normal limits.

Left carotid system: Left common and internal carotid artery's
widely patent without stenosis, dissection, intraluminal thrombus,
or occlusion. No significant atheromatous narrowing about the left
carotid bifurcation. Left external carotid artery and its branches
within normal limits.

Vertebral arteries: Both of the vertebral arteries arise from the
subclavian arteries. Vertebral arteries widely patent without
stenosis, dissection, or occlusion.

Skeleton: No acute osseus abnormality. No worrisome lytic or blastic
osseous lesions.

Other neck: No acute soft tissue abnormality within the neck.
Salivary glands normal. No adenopathy. Thyroid normal.

Upper chest: Visualized upper chest within normal limits. Visualized
lungs are clear.
IMPRESSION: Normal CTA of the neck. No evidence for dissection or other acute
vascular abnormality.

## 2020-10-16 ENCOUNTER — Ambulatory Visit: Payer: BLUE CROSS/BLUE SHIELD | Admitting: Family Medicine

## 2021-12-29 ENCOUNTER — Other Ambulatory Visit: Payer: Self-pay

## 2021-12-29 ENCOUNTER — Ambulatory Visit: Payer: BC Managed Care – PPO | Admitting: Internal Medicine

## 2021-12-29 ENCOUNTER — Encounter: Payer: Self-pay | Admitting: Internal Medicine

## 2021-12-29 VITALS — BP 114/70 | HR 86 | Temp 98.3°F | Ht 70.0 in | Wt 144.0 lb

## 2021-12-29 DIAGNOSIS — Z0001 Encounter for general adult medical examination with abnormal findings: Secondary | ICD-10-CM | POA: Diagnosis not present

## 2021-12-29 DIAGNOSIS — Z0000 Encounter for general adult medical examination without abnormal findings: Secondary | ICD-10-CM | POA: Diagnosis not present

## 2021-12-29 DIAGNOSIS — F900 Attention-deficit hyperactivity disorder, predominantly inattentive type: Secondary | ICD-10-CM

## 2021-12-29 DIAGNOSIS — Z1159 Encounter for screening for other viral diseases: Secondary | ICD-10-CM | POA: Diagnosis not present

## 2021-12-29 LAB — BASIC METABOLIC PANEL
BUN: 19 mg/dL (ref 6–23)
CO2: 27 mEq/L (ref 19–32)
Calcium: 10 mg/dL (ref 8.4–10.5)
Chloride: 101 mEq/L (ref 96–112)
Creatinine, Ser: 0.84 mg/dL (ref 0.40–1.50)
GFR: 118.47 mL/min (ref >60.00)
Glucose, Bld: 86 mg/dL (ref 70–99)
Potassium: 4.1 mEq/L (ref 3.5–5.1)
Sodium: 139 mEq/L (ref 135–145)

## 2021-12-29 LAB — CBC WITH DIFFERENTIAL/PLATELET
Basophils Absolute: 0 10*3/uL (ref 0.0–0.1)
Basophils Relative: 1.1 % (ref 0.0–3.0)
Eosinophils Absolute: 0.1 10*3/uL (ref 0.0–0.7)
Eosinophils Relative: 3.8 % (ref 0.0–5.0)
HCT: 45 % (ref 39.0–52.0)
Hemoglobin: 15.2 g/dL (ref 13.0–17.0)
Lymphocytes Relative: 36.9 % (ref 12.0–46.0)
Lymphs Abs: 1.3 10*3/uL (ref 0.7–4.0)
MCHC: 33.9 g/dL (ref 30.0–36.0)
MCV: 94.1 fl (ref 78.0–100.0)
Monocytes Absolute: 0.3 10*3/uL (ref 0.1–1.0)
Monocytes Relative: 9.4 % (ref 3.0–12.0)
Neutro Abs: 1.7 10*3/uL (ref 1.4–7.7)
Neutrophils Relative %: 48.8 % (ref 43.0–77.0)
Platelets: 218 10*3/uL (ref 150.0–400.0)
RBC: 4.78 Mil/uL (ref 4.22–5.81)
RDW: 12.8 % (ref 11.5–15.5)
WBC: 3.5 10*3/uL — ABNORMAL LOW (ref 4.0–10.5)

## 2021-12-29 LAB — HEPATIC FUNCTION PANEL
ALT: 46 U/L (ref 0–53)
AST: 18 U/L (ref 0–37)
Albumin: 4.9 g/dL (ref 3.5–5.2)
Alkaline Phosphatase: 48 U/L (ref 39–117)
Bilirubin, Direct: 0.2 mg/dL (ref 0.0–0.3)
Total Bilirubin: 1.1 mg/dL (ref 0.2–1.2)
Total Protein: 7.3 g/dL (ref 6.0–8.3)

## 2021-12-29 LAB — TSH: TSH: 1.5 u[IU]/mL (ref 0.35–5.50)

## 2021-12-29 MED ORDER — LISDEXAMFETAMINE DIMESYLATE 20 MG PO CAPS: 20.0000 mg | ORAL_CAPSULE | Freq: Every day | ORAL | 0 refills | Status: DC

## 2021-12-29 NOTE — Patient Instructions (Signed)

## 2021-12-29 NOTE — Progress Notes (Signed)
? ?Subjective:  ?Patient ID: Edward Randall, male    DOB: September 19, 1991  Age: 31 y.o. MRN: [id number] ? ?CC: Annual Exam and ADHD ? ?This visit occurred during the SARS-CoV-2 public health emergency.  Safety protocols were in place, including screening questions prior to the visit, additional usage of staff PPE, and extensive cleaning of exam room while observing appropriate contact time as indicated for disinfecting solutions.   ? ?HPI ?Edward Randall presents for a CPX and to establish. ? ?He has a history of ADHD.  He tells me he has been consuming a lot of caffeine recently to help improve his focus.  He complains of difficulty finishing projects at work and around the house.  He gets easily distracted and lacks focus.  He denies signs and symptoms of depression, anxiety, feeling hopeless, helpless, suicidal, or homicidal. ? ?History ?Edward Randall has a past medical history of ADHD (attention deficit hyperactivity disorder).  ? ?He has no past surgical history on file.  ? ?His family history includes Alcoholism in his father; Bipolar disorder in his mother; Colon cancer in his maternal grandfather; Diabetes in his father; Fibromyalgia in his paternal grandmother; Hypertension in his mother; Prostate cancer in his paternal grandfather.He reports that he has never smoked. He has never used smokeless tobacco. He reports current alcohol use of about 20.0 standard drinks per week. He reports that he does not currently use drugs. ? ?Outpatient Medications Prior to Visit  ?Medication Sig Dispense Refill  ? buPROPion (WELLBUTRIN SR) 150 MG 12 hr tablet TAKE ONE TABLET BY MOUTH TWICE DAILY 180 tablet 1  ? meclizine (ANTIVERT) 25 MG tablet Take 25 mg by mouth 3 (three) times daily as needed for dizziness.    ? ?No facility-administered medications prior to visit.  ? ? ?ROS ?Review of Systems  ?Constitutional: Negative.  Negative for diaphoresis and fatigue.  ?HENT: Negative.    ?Eyes: Negative.   ?Respiratory:  Negative for cough,  chest tightness and shortness of breath.   ?Cardiovascular:  Negative for chest pain, palpitations and leg swelling.  ?Gastrointestinal:  Negative for abdominal pain, constipation, diarrhea and nausea.  ?Genitourinary:  Negative for difficulty urinating, penile swelling, scrotal swelling and testicular pain.  ?     He had an episode of scrotal pain one month ago but none since.  ?Musculoskeletal: Negative.   ?Skin: Negative.   ?Hematological:  Negative for adenopathy. Does not bruise/bleed easily.  ?Psychiatric/Behavioral:  Positive for decreased concentration. Negative for behavioral problems, hallucinations, self-injury, sleep disturbance and suicidal ideas. The patient is not nervous/anxious and is not hyperactive.   ? ?Objective:  ?BP 114/70 (BP Location: Left Arm, Patient Position: Sitting, Cuff Size: Large)   Pulse 86   Temp 98.3 ?F (36.8 ?C) (Oral)   Ht 5\' 10"  (1.778 m)   Wt 144 lb (65.3 kg)   SpO2 97%   BMI 20.66 kg/m?  ? ?Physical Exam ?Vitals reviewed.  ?HENT:  ?   Nose: Nose normal.  ?   Mouth/Throat:  ?   Mouth: Mucous membranes are moist.  ?Eyes:  ?   General: No scleral icterus. ?   Conjunctiva/sclera: Conjunctivae normal.  ?Cardiovascular:  ?   Rate and Rhythm: Regular rhythm.  ?   Heart sounds: No murmur heard. ?Pulmonary:  ?   Effort: Pulmonary effort is normal.  ?   Breath sounds: No stridor. No wheezing, rhonchi or rales.  ?Abdominal:  ?   General: Abdomen is flat.  ?   Palpations: There is no  mass.  ?   Tenderness: There is no abdominal tenderness. There is no guarding.  ?   Hernia: No hernia is present. There is no hernia in the left inguinal area or right inguinal area.  ?Genitourinary: ?   Pubic Area: No rash.   ?   Penis: Normal and circumcised.   ?   Testes: Normal.  ?   Epididymis:  ?   Right: Normal. Not inflamed or enlarged. No mass.  ?   Left: Normal. Not inflamed or enlarged. No mass.  ?Musculoskeletal:     ?   General: Normal range of motion.  ?   Cervical back: Neck supple.  ?    Right lower leg: No edema.  ?   Left lower leg: No edema.  ?Lymphadenopathy:  ?   Cervical: No cervical adenopathy.  ?   Lower Body: No right inguinal adenopathy. No left inguinal adenopathy.  ?Skin: ?   General: Skin is warm and dry.  ?   Coloration: Skin is not pale.  ?Neurological:  ?   General: No focal deficit present.  ?   Mental Status: He is alert. Mental status is at baseline.  ? ? ?Lab Results  ?Component Value Date  ? WBC 3.5 (L) 12/29/2021  ? HGB 15.2 12/29/2021  ? HCT 45.0 12/29/2021  ? PLT 218.0 12/29/2021  ? GLUCOSE 86 12/29/2021  ? CHOL 137 09/23/2015  ? TRIG 93.0 09/23/2015  ? HDL 45.00 09/23/2015  ? LDLCALC 74 09/23/2015  ? ALT 46 12/29/2021  ? AST 18 12/29/2021  ? NA 139 12/29/2021  ? K 4.1 12/29/2021  ? CL 101 12/29/2021  ? CREATININE 0.84 12/29/2021  ? BUN 19 12/29/2021  ? CO2 27 12/29/2021  ? TSH 1.50 12/29/2021  ?  ? ?Assessment & Plan:  ? ?Edward Randall was seen today for annual exam and adhd. ? ?Diagnoses and all orders for this visit: ? ?Encounter for general adult medical examination with abnormal findings- Exam completed, labs reviewed, vaccines are up-to-date, no cancer screenings indicated, patient education was given. ?-     Hepatitis C antibody; Future ?-     HIV Antibody (routine testing w rflx); Future ?-     HIV Antibody (routine testing w rflx) ?-     Hepatitis C antibody ? ?Need for hepatitis C screening test ?-     Hepatitis C antibody; Future ?-     Hepatitis C antibody ? ?ADHD (attention deficit hyperactivity disorder), inattentive type- Labs are negative for secondary causes or complications.  Will treat with lisdexamfetamine. ?-     Basic metabolic panel; Future ?-     TSH; Future ?-     Hepatic function panel; Future ?-     CBC with Differential/Platelet; Future ?-     lisdexamfetamine (VYVANSE) 20 MG capsule; Take 1 capsule (20 mg total) by mouth daily. ?-     CBC with Differential/Platelet ?-     Hepatic function panel ?-     TSH ?-     Basic metabolic panel ? ? ?I have  discontinued Edward Randall. Edward Randall's meclizine and buPROPion. I am also having him start on lisdexamfetamine. ? ?Meds ordered this encounter  ?Medications  ? lisdexamfetamine (VYVANSE) 20 MG capsule  ?  Sig: Take 1 capsule (20 mg total) by mouth daily.  ?  Dispense:  30 capsule  ?  Refill:  0  ? ? ? ?Follow-up: Return in about 3 months (around 03/31/2022). ? ?Sanda Linger, MD ?

## 2021-12-29 NOTE — Telephone Encounter (Signed)
Key: [id number]  ?

## 2021-12-30 LAB — HIV ANTIBODY (ROUTINE TESTING W REFLEX): HIV 1&2 Ab, 4th Generation: NONREACTIVE

## 2021-12-30 LAB — HEPATITIS C ANTIBODY
Hepatitis C Ab: NONREACTIVE
SIGNAL TO CUT-OFF: <0.02 (ref <1.00)

## 2021-12-31 ENCOUNTER — Encounter: Payer: Self-pay | Admitting: Internal Medicine

## 2022-01-07 ENCOUNTER — Other Ambulatory Visit: Payer: Self-pay | Admitting: Internal Medicine

## 2022-01-07 DIAGNOSIS — F900 Attention-deficit hyperactivity disorder, predominantly inattentive type: Secondary | ICD-10-CM

## 2022-01-07 MED ORDER — AMPHETAMINE-DEXTROAMPHETAMINE 10 MG PO TABS: 10.0000 mg | ORAL_TABLET | Freq: Two times a day (BID) | ORAL | 0 refills | Status: DC

## 2022-01-07 NOTE — Telephone Encounter (Signed)
2nd attempt  ?Key: [id number] ?

## 2022-01-09 ENCOUNTER — Other Ambulatory Visit: Payer: Self-pay | Admitting: Internal Medicine

## 2022-01-09 DIAGNOSIS — F900 Attention-deficit hyperactivity disorder, predominantly inattentive type: Secondary | ICD-10-CM

## 2022-01-09 MED ORDER — AMPHETAMINE-DEXTROAMPHETAMINE 10 MG PO TABS: 10.0000 mg | ORAL_TABLET | Freq: Two times a day (BID) | ORAL | 0 refills | Status: DC

## 2022-02-05 ENCOUNTER — Encounter: Payer: Self-pay | Admitting: Internal Medicine

## 2022-02-05 ENCOUNTER — Other Ambulatory Visit: Payer: Self-pay | Admitting: Internal Medicine

## 2022-02-05 DIAGNOSIS — F900 Attention-deficit hyperactivity disorder, predominantly inattentive type: Secondary | ICD-10-CM

## 2022-02-05 MED ORDER — AMPHETAMINE-DEXTROAMPHETAMINE 10 MG PO TABS: 10.0000 mg | ORAL_TABLET | Freq: Two times a day (BID) | ORAL | 0 refills | Status: DC

## 2022-03-11 ENCOUNTER — Other Ambulatory Visit: Payer: Self-pay | Admitting: Internal Medicine

## 2022-03-11 DIAGNOSIS — F900 Attention-deficit hyperactivity disorder, predominantly inattentive type: Secondary | ICD-10-CM

## 2022-03-11 MED ORDER — AMPHETAMINE-DEXTROAMPHETAMINE 10 MG PO TABS: 10.0000 mg | ORAL_TABLET | Freq: Two times a day (BID) | ORAL | 0 refills | Status: DC

## 2022-03-30 ENCOUNTER — Encounter: Payer: Self-pay | Admitting: Internal Medicine

## 2022-03-30 ENCOUNTER — Ambulatory Visit: Payer: BC Managed Care – PPO | Admitting: Internal Medicine

## 2022-03-30 VITALS — BP 114/68 | HR 90 | Temp 97.9°F | Resp 16 | Ht 70.0 in | Wt 143.0 lb

## 2022-03-30 DIAGNOSIS — F900 Attention-deficit hyperactivity disorder, predominantly inattentive type: Secondary | ICD-10-CM | POA: Diagnosis not present

## 2022-03-30 NOTE — Progress Notes (Signed)
Subjective:  Patient ID: Edward Randall, male    DOB: 04-21-1991  Age: 31 y.o. MRN: [id number]  CC: Follow-up   HPI Edward Randall presents for f/up -  Pt states ADD status overall stable on current meds with overall good compliance and tolerability, and good effectiveness with respect to ability for concentration and task completion.   Outpatient Medications Prior to Visit  Medication Sig Dispense Refill   amphetamine-dextroamphetamine (ADDERALL) 10 MG tablet Take 1 tablet (10 mg total) by mouth 2 (two) times daily with a meal. 60 tablet 0   No facility-administered medications prior to visit.    ROS Review of Systems  Constitutional: Negative.   HENT: Negative.    Eyes: Negative.   Respiratory:  Negative for cough and shortness of breath.   Cardiovascular:  Negative for chest pain, palpitations and leg swelling.  Gastrointestinal:  Negative for abdominal pain and nausea.  Genitourinary:  Negative for difficulty urinating.  Musculoskeletal: Negative.   Skin: Negative.   Neurological: Negative.  Negative for dizziness and weakness.  Psychiatric/Behavioral: Negative.  Negative for confusion, decreased concentration, dysphoric mood and suicidal ideas. The patient is not nervous/anxious.    Objective:  BP 114/68 (BP Location: Right Arm, Patient Position: Sitting, Cuff Size: Large)   Pulse 90   Temp 97.9 F (36.6 C) (Oral)   Resp 16   Ht 5\' 10"  (1.778 m)   Wt 143 lb (64.9 kg)   SpO2 98%   BMI 20.52 kg/m   BP Readings from Last 3 Encounters:  03/30/22 114/68  12/29/21 114/70  04/15/17 (!) 110/58    Wt Readings from Last 3 Encounters:  03/30/22 143 lb (64.9 kg)  12/29/21 144 lb (65.3 kg)  04/15/17 138 lb (62.6 kg)    Physical Exam Vitals reviewed.  Constitutional:      Appearance: He is not ill-appearing.  HENT:     Nose: Nose normal.     Mouth/Throat:     Mouth: Mucous membranes are moist.  Eyes:     General: No scleral icterus.    Conjunctiva/sclera:  Conjunctivae normal.  Cardiovascular:     Rate and Rhythm: Normal rate and regular rhythm.     Heart sounds: No murmur heard. Pulmonary:     Effort: Pulmonary effort is normal.     Breath sounds: No stridor. No wheezing, rhonchi or rales.  Abdominal:     General: Abdomen is flat.     Palpations: There is no mass.     Tenderness: There is no abdominal tenderness. There is no guarding.     Hernia: No hernia is present.  Musculoskeletal:        General: Normal range of motion.     Cervical back: Neck supple.     Right lower leg: No edema.     Left lower leg: No edema.  Lymphadenopathy:     Cervical: No cervical adenopathy.  Skin:    General: Skin is warm and dry.  Neurological:     General: No focal deficit present.     Mental Status: He is alert.  Psychiatric:        Mood and Affect: Mood normal.        Behavior: Behavior normal.    Lab Results  Component Value Date   WBC 3.5 (L) 12/29/2021   HGB 15.2 12/29/2021   HCT 45.0 12/29/2021   PLT 218.0 12/29/2021   GLUCOSE 86 12/29/2021   CHOL 137 09/23/2015   TRIG 93.0 09/23/2015  HDL 45.00 09/23/2015   LDLCALC 74 09/23/2015   ALT 46 12/29/2021   AST 18 12/29/2021   NA 139 12/29/2021   K 4.1 12/29/2021   CL 101 12/29/2021   CREATININE 0.84 12/29/2021   BUN 19 12/29/2021   CO2 27 12/29/2021   TSH 1.50 12/29/2021    CT Angio Neck W and/or Wo Contrast  Result Date: 04/09/2017 CLINICAL DATA:  Initial evaluation for acute vertigo, concern for possible dissection flap in left neck on prior ultrasound. EXAM: CT ANGIOGRAPHY NECK TECHNIQUE: Multidetector CT imaging of the neck was performed using the standard protocol during bolus administration of intravenous contrast. Multiplanar CT image reconstructions and MIPs were obtained to evaluate the vascular anatomy. Carotid stenosis measurements (when applicable) are obtained utilizing NASCET criteria, using the distal internal carotid diameter as the denominator. CONTRAST:  50 cc of  Isovue 370. COMPARISON:  None available. FINDINGS: Aortic arch: Visualized aortic arch is normal in caliber and appearance with normal 3 vessel morphology. No flow-limiting stenosis about the origin of the great vessels. Visualized subclavian arteries widely patent. Right carotid system: Right common and internal carotid arteries are widely patent without stenosis, dissection, intraluminal thrombus, or occlusion. No significant atheromatous narrowing about the right carotid bifurcation. Right external carotid artery and its branches within normal limits. Left carotid system: Left common and internal carotid artery's widely patent without stenosis, dissection, intraluminal thrombus, or occlusion. No significant atheromatous narrowing about the left carotid bifurcation. Left external carotid artery and its branches within normal limits. Vertebral arteries: Both of the vertebral arteries arise from the subclavian arteries. Vertebral arteries widely patent without stenosis, dissection, or occlusion. Skeleton: No acute osseus abnormality. No worrisome lytic or blastic osseous lesions. Other neck: No acute soft tissue abnormality within the neck. Salivary glands normal. No adenopathy. Thyroid normal. Upper chest: Visualized upper chest within normal limits. Visualized lungs are clear. IMPRESSION: Normal CTA of the neck. No evidence for dissection or other acute vascular abnormality. Electronically Signed   By: Rise Mu M.D.   On: 04/09/2017 19:35    Assessment & Plan:   Atari was seen today for follow-up.  Diagnoses and all orders for this visit:  ADHD (attention deficit hyperactivity disorder), inattentive type- He is doing well on the current adderall dose. Will continue.   I am having Edward Randall. Viele maintain his amphetamine-dextroamphetamine.  No orders of the defined types were placed in this encounter.    Follow-up: No follow-ups on file.  Sanda Linger, MD

## 2022-04-09 ENCOUNTER — Other Ambulatory Visit: Payer: Self-pay | Admitting: Internal Medicine

## 2022-04-09 DIAGNOSIS — F900 Attention-deficit hyperactivity disorder, predominantly inattentive type: Secondary | ICD-10-CM

## 2022-04-10 MED ORDER — AMPHETAMINE-DEXTROAMPHETAMINE 10 MG PO TABS: 10.0000 mg | ORAL_TABLET | Freq: Two times a day (BID) | ORAL | 0 refills | Status: DC

## 2022-04-17 ENCOUNTER — Encounter: Payer: Self-pay | Admitting: Internal Medicine

## 2022-05-09 ENCOUNTER — Other Ambulatory Visit: Payer: Self-pay | Admitting: Internal Medicine

## 2022-05-09 DIAGNOSIS — F900 Attention-deficit hyperactivity disorder, predominantly inattentive type: Secondary | ICD-10-CM

## 2022-05-11 MED ORDER — AMPHETAMINE-DEXTROAMPHETAMINE 10 MG PO TABS: 10.0000 mg | ORAL_TABLET | Freq: Two times a day (BID) | ORAL | 0 refills | Status: DC

## 2022-06-15 ENCOUNTER — Other Ambulatory Visit: Payer: Self-pay | Admitting: Internal Medicine

## 2022-06-15 DIAGNOSIS — F900 Attention-deficit hyperactivity disorder, predominantly inattentive type: Secondary | ICD-10-CM

## 2022-06-17 MED ORDER — AMPHETAMINE-DEXTROAMPHETAMINE 10 MG PO TABS: 10.0000 mg | ORAL_TABLET | Freq: Two times a day (BID) | ORAL | 0 refills | Status: DC

## 2022-07-07 ENCOUNTER — Encounter: Payer: Self-pay | Admitting: Internal Medicine

## 2022-07-08 ENCOUNTER — Encounter: Payer: Self-pay | Admitting: Internal Medicine

## 2022-07-08 ENCOUNTER — Other Ambulatory Visit: Payer: BC Managed Care – PPO

## 2022-07-08 ENCOUNTER — Ambulatory Visit: Payer: BC Managed Care – PPO | Admitting: Internal Medicine

## 2022-07-08 VITALS — BP 126/76 | HR 102 | Temp 98.3°F | Resp 16 | Ht 70.0 in | Wt 153.0 lb

## 2022-07-08 DIAGNOSIS — J039 Acute tonsillitis, unspecified: Secondary | ICD-10-CM | POA: Diagnosis not present

## 2022-07-08 DIAGNOSIS — D72820 Lymphocytosis (symptomatic): Secondary | ICD-10-CM | POA: Diagnosis not present

## 2022-07-08 DIAGNOSIS — J020 Streptococcal pharyngitis: Secondary | ICD-10-CM

## 2022-07-08 DIAGNOSIS — B2700 Gammaherpesviral mononucleosis without complication: Secondary | ICD-10-CM

## 2022-07-08 DIAGNOSIS — D696 Thrombocytopenia, unspecified: Secondary | ICD-10-CM | POA: Diagnosis not present

## 2022-07-08 LAB — CBC WITH DIFFERENTIAL/PLATELET
Basophils Absolute: 0 10*3/uL (ref 0.0–0.1)
Basophils Relative: 0.3 % (ref 0.0–3.0)
Eosinophils Absolute: 0 10*3/uL (ref 0.0–0.7)
Eosinophils Relative: 0.3 % (ref 0.0–5.0)
HCT: 44.1 % (ref 39.0–52.0)
Hemoglobin: 15.3 g/dL (ref 13.0–17.0)
Lymphocytes Relative: 78.2 % — ABNORMAL HIGH (ref 12.0–46.0)
Lymphs Abs: 7 10*3/uL — ABNORMAL HIGH (ref 0.7–4.0)
MCHC: 34.6 g/dL (ref 30.0–36.0)
MCV: 89.8 fl (ref 78.0–100.0)
Monocytes Absolute: 0.6 10*3/uL (ref 0.1–1.0)
Monocytes Relative: 7.1 % (ref 3.0–12.0)
Neutro Abs: 1.4 10*3/uL (ref 1.4–7.7)
Neutrophils Relative %: 14.1 % — ABNORMAL LOW (ref 43.0–77.0)
Platelets: 131 10*3/uL — ABNORMAL LOW (ref 150.0–400.0)
RBC: 4.91 Mil/uL (ref 4.22–5.81)
RDW: 12.5 % (ref 11.5–15.5)
WBC: 8.9 10*3/uL (ref 4.0–10.5)

## 2022-07-08 LAB — HEPATIC FUNCTION PANEL
ALT: 56 U/L — ABNORMAL HIGH (ref 0–53)
AST: 46 U/L — ABNORMAL HIGH (ref 0–37)
Albumin: 4.2 g/dL (ref 3.5–5.2)
Alkaline Phosphatase: 93 U/L (ref 39–117)
Bilirubin, Direct: 0.1 mg/dL (ref 0.0–0.3)
Total Bilirubin: 0.6 mg/dL (ref 0.2–1.2)
Total Protein: 7.1 g/dL (ref 6.0–8.3)

## 2022-07-08 LAB — POCT RAPID STREP A (OFFICE): Rapid Strep A Screen: NEGATIVE

## 2022-07-08 NOTE — Progress Notes (Addendum)
Subjective:  Patient ID: Edward Randall, male    DOB: 1991/07/18  Age: 31 y.o. MRN: [id number]  CC: Sore Throat   HPI Edward Randall presents for f/up -  He complains of a 6-day history of ST, fever to 101.7 with chills, headache, lymphadenopathy, fatigue, and abdominal pain.  Outpatient Medications Prior to Visit  Medication Sig Dispense Refill   amphetamine-dextroamphetamine (ADDERALL) 10 MG tablet Take 1 tablet (10 mg total) by mouth 2 (two) times daily with a meal. 60 tablet 0   No facility-administered medications prior to visit.    ROS Review of Systems  Constitutional:  Positive for chills, fatigue and fever. Negative for diaphoresis.  HENT:  Positive for sore throat. Negative for sinus pressure and trouble swallowing.   Respiratory:  Negative for cough, chest tightness, shortness of breath, wheezing and stridor.   Cardiovascular:  Negative for chest pain, palpitations and leg swelling.  Gastrointestinal:  Positive for abdominal pain. Negative for diarrhea, nausea and vomiting.  Genitourinary: Negative.  Negative for difficulty urinating.  Musculoskeletal: Negative.   Skin: Negative.  Negative for rash.  Neurological: Negative.  Negative for dizziness and weakness.  Hematological:  Positive for adenopathy. Does not bruise/bleed easily.  Psychiatric/Behavioral: Negative.      Objective:  BP 126/76 (BP Location: Right Arm, Patient Position: Sitting, Cuff Size: Large)   Pulse (!) 102   Temp 98.3 F (36.8 C) (Oral)   Resp 16   Ht 5\' 10"  (1.778 m)   Wt 153 lb (69.4 kg)   SpO2 97%   BMI 21.95 kg/m   BP Readings from Last 3 Encounters:  07/08/22 126/76  03/30/22 114/68  12/29/21 114/70    Wt Readings from Last 3 Encounters:  07/08/22 153 lb (69.4 kg)  03/30/22 143 lb (64.9 kg)  12/29/21 144 lb (65.3 kg)    Physical Exam Vitals reviewed.  Constitutional:      General: He is not in acute distress.    Appearance: He is ill-appearing. He is not  toxic-appearing or diaphoretic.  HENT:     Mouth/Throat:     Mouth: Mucous membranes are moist.     Palate: No mass.     Pharynx: Posterior oropharyngeal erythema present. No oropharyngeal exudate.     Tonsils: Tonsillar exudate present. No tonsillar abscesses.  Eyes:     General: No scleral icterus.    Conjunctiva/sclera: Conjunctivae normal.  Cardiovascular:     Rate and Rhythm: Normal rate and regular rhythm.     Heart sounds: No murmur heard. Pulmonary:     Effort: Pulmonary effort is normal.     Breath sounds: No stridor. No wheezing, rhonchi or rales.  Abdominal:     General: Abdomen is flat. Bowel sounds are normal.     Palpations: There is no hepatomegaly or splenomegaly.     Tenderness: There is abdominal tenderness in the right upper quadrant.  Musculoskeletal:     Cervical back: Neck supple.  Lymphadenopathy:     Head:     Right side of head: No occipital adenopathy.     Left side of head: No occipital adenopathy.     Cervical: Cervical adenopathy present.     Right cervical: Superficial cervical adenopathy present. No deep or posterior cervical adenopathy.    Left cervical: Superficial cervical adenopathy present. No deep or posterior cervical adenopathy.     Upper Body:     Right upper body: No supraclavicular or axillary adenopathy.     Left upper body:  No supraclavicular or axillary adenopathy.     Lab Results  Component Value Date   WBC 8.9 07/08/2022   HGB 15.3 07/08/2022   HCT 44.1 07/08/2022   PLT 131.0 (L) 07/08/2022   GLUCOSE 86 12/29/2021   CHOL 137 09/23/2015   TRIG 93.0 09/23/2015   HDL 45.00 09/23/2015   LDLCALC 74 09/23/2015   ALT 56 (H) 07/08/2022   AST 46 (H) 07/08/2022   NA 139 12/29/2021   K 4.1 12/29/2021   CL 101 12/29/2021   CREATININE 0.84 12/29/2021   BUN 19 12/29/2021   CO2 27 12/29/2021   TSH 1.50 12/29/2021    CT Angio Neck W and/or Wo Contrast  Result Date: 04/09/2017 CLINICAL DATA:  Initial evaluation for acute  vertigo, concern for possible dissection flap in left neck on prior ultrasound. EXAM: CT ANGIOGRAPHY NECK TECHNIQUE: Multidetector CT imaging of the neck was performed using the standard protocol during bolus administration of intravenous contrast. Multiplanar CT image reconstructions and MIPs were obtained to evaluate the vascular anatomy. Carotid stenosis measurements (when applicable) are obtained utilizing NASCET criteria, using the distal internal carotid diameter as the denominator. CONTRAST:  50 cc of Isovue 370. COMPARISON:  None available. FINDINGS: Aortic arch: Visualized aortic arch is normal in caliber and appearance with normal 3 vessel morphology. No flow-limiting stenosis about the origin of the great vessels. Visualized subclavian arteries widely patent. Right carotid system: Right common and internal carotid arteries are widely patent without stenosis, dissection, intraluminal thrombus, or occlusion. No significant atheromatous narrowing about the right carotid bifurcation. Right external carotid artery and its branches within normal limits. Left carotid system: Left common and internal carotid artery's widely patent without stenosis, dissection, intraluminal thrombus, or occlusion. No significant atheromatous narrowing about the left carotid bifurcation. Left external carotid artery and its branches within normal limits. Vertebral arteries: Both of the vertebral arteries arise from the subclavian arteries. Vertebral arteries widely patent without stenosis, dissection, or occlusion. Skeleton: No acute osseus abnormality. No worrisome lytic or blastic osseous lesions. Other neck: No acute soft tissue abnormality within the neck. Salivary glands normal. No adenopathy. Thyroid normal. Upper chest: Visualized upper chest within normal limits. Visualized lungs are clear. IMPRESSION: Normal CTA of the neck. No evidence for dissection or other acute vascular abnormality. Electronically Signed   By:  Rise Mu M.D.   On: 04/09/2017 19:35    Assessment & Plan:   Tilman was seen today for sore throat.  Diagnoses and all orders for this visit:  Tonsillitis with exudate- Rapid strep screen is negative.  Labs are consistent with EBV mononucleosis.  I recommended that he rest. -     EPSTEIN-BARR VIRUS (EBV) Antibody Profile; Future -     CBC with Differential/Platelet; Future -     Hepatic function panel; Future -     POCT rapid strep A -     Hepatic function panel -     CBC with Differential/Platelet -     EPSTEIN-BARR VIRUS (EBV) Antibody Profile  EBV positive mononucleosis syndrome   I am having Edward Randall. Dome maintain his amphetamine-dextroamphetamine.  No orders of the defined types were placed in this encounter.    Follow-up: Return in about 3 months (around 10/07/2022).  Sanda Linger, MD

## 2022-07-08 NOTE — Patient Instructions (Signed)
Tonsillitis  Tonsillitis is an infection of the throat that causes the tonsils to become red, tender, and swollen. Tonsils are tissues in the back of your throat. Each tonsil has crevices (crypts). Tonsils normally work to protect the body from infection. What are the causes? Sudden (acute) tonsillitis may be caused by a virus or bacteria, including streptococcal bacteria. Long-lasting (chronic) tonsillitis occurs when the crypts of the tonsils become filled with pieces of food and bacteria, which makes it easy for the tonsils to become repeatedly infected. Tonsillitis can be spread from person to person when it is caused by a virus or bacteria. It may be spread by inhaling droplets that are released with coughing or sneezing. You may also come into contact with viruses or bacteria on surfaces, such as cups or utensils. What are the signs or symptoms? Symptoms of this condition include: A sore throat. This may include trouble swallowing. White patches on the tonsils. Swollen tonsils. Fever. Headache. Tiredness. Loss of appetite. Snoring during sleep when you did not snore before. Small, foul-smelling, yellowish-white pieces of material (tonsilloliths) that you occasionally cough up or spit out. These can cause you to have bad breath. How is this diagnosed? This condition is diagnosed with a physical exam. Diagnosis can be confirmed with the results of lab tests, including a throat culture. How is this treated? Treatment for this condition depends on the cause, but usually focuses on treating the symptoms associated with it. Treatment may include: Medicines to relieve pain and manage fever. Steroid medicines to reduce swelling. Antibiotic medicines if the condition is caused by bacteria. If episodes of tonsillitis are severe and frequent, your health care provider may recommend surgery to remove the tonsils (tonsillectomy). Follow these instructions at home: Medicines Take over-the-counter  and prescription medicines only as told by your health care provider. If you were prescribed an antibiotic medicine, take it as told by your health care provider. Do not stop taking the antibiotic even if you start to feel better. Eating and drinking Drink enough fluid to keep your urine pale yellow. While your throat is sore, eat soft or liquid foods, such as sherbet, soups, or soft, warm cereals, such as oatmeal or hot wheat cereal. Drink warm liquids. Eat frozen ice pops. General instructions Rest as much as possible and get plenty of sleep. Gargle with a mixture of salt and water 3-4 times a day or as needed. To make salt water, completely dissolve -1 tsp (3-6 g) of salt in 1 cup (237 mL) of warm water. Do not swallow the mixture of salt and water. Wash your hands regularly with soap and water for at least 20 seconds. If soap and water are not available, use hand sanitizer. Do not share cups, bottles, or other utensils until your symptoms have gone away. Do not use any products that contain nicotine or tobacco. These products include cigarettes, chewing tobacco, and vaping devices, such as e-cigarettes. If you need help quitting, ask your health care provider. Keep all follow-up visits. This is important. Contact a health care provider if: You notice large, tender lumps in your neck that were not there before. You have a fever that does not go away after 2-3 days. You develop a rash. You cough up a green, yellow-brown, or bloody substance. You cannot swallow liquids or food for 24 hours. Only one of your tonsils is swollen. Get help right away if: You develop any new symptoms, such as vomiting, severe headache, stiff neck, chest pain, trouble breathing, or   trouble swallowing. You have severe throat pain along with drooling or voice changes. You have severe pain that is not controlled with medicines. You cannot fully open your mouth. You develop redness, swelling, or severe pain  anywhere in your neck. Summary Tonsillitis is an infection of the throat that causes the tonsils to become red, tender, and swollen. The most common symptom is pain in the throat. Tonsillitis is most often caused by a virus or bacteria. Get help right away if you develop any new symptoms, such as vomiting, severe headache, stiff neck, chest pain, or trouble breathing. This information is not intended to replace advice given to you by your health care provider. Make sure you discuss any questions you have with your health care provider. Document Revised: 03/06/2021 Document Reviewed: 03/06/2021 [employer] Patient Education  2023 [employer]  

## 2022-07-09 LAB — PATHOLOGIST SMEAR REVIEW

## 2022-07-10 DIAGNOSIS — B2700 Gammaherpesviral mononucleosis without complication: Secondary | ICD-10-CM | POA: Insufficient documentation

## 2022-07-10 LAB — EPSTEIN-BARR VIRUS (EBV) ANTIBODY PROFILE
EBV NA IgG: <18 U/mL (ref 0.0–17.9)
EBV VCA IgG: 63.5 U/mL — ABNORMAL HIGH (ref 0.0–17.9)
EBV VCA IgM: >160 U/mL — ABNORMAL HIGH (ref 0.0–35.9)

## 2022-07-30 ENCOUNTER — Other Ambulatory Visit: Payer: Self-pay | Admitting: Internal Medicine

## 2022-07-30 DIAGNOSIS — F900 Attention-deficit hyperactivity disorder, predominantly inattentive type: Secondary | ICD-10-CM

## 2022-07-31 MED ORDER — AMPHETAMINE-DEXTROAMPHETAMINE 10 MG PO TABS: 10.0000 mg | ORAL_TABLET | Freq: Two times a day (BID) | ORAL | 0 refills | Status: DC

## 2022-09-14 ENCOUNTER — Other Ambulatory Visit: Payer: Self-pay | Admitting: Internal Medicine

## 2022-09-14 DIAGNOSIS — F900 Attention-deficit hyperactivity disorder, predominantly inattentive type: Secondary | ICD-10-CM

## 2022-09-14 MED ORDER — AMPHETAMINE-DEXTROAMPHETAMINE 10 MG PO TABS: 10.0000 mg | ORAL_TABLET | Freq: Two times a day (BID) | ORAL | 0 refills | Status: DC

## 2023-01-18 ENCOUNTER — Encounter: Payer: Self-pay | Admitting: Internal Medicine

## 2023-01-18 ENCOUNTER — Ambulatory Visit: Payer: BC Managed Care – PPO | Admitting: Internal Medicine

## 2023-01-18 VITALS — BP 112/78 | HR 89 | Temp 98.5°F | Ht 70.0 in | Wt 150.0 lb

## 2023-01-18 DIAGNOSIS — R197 Diarrhea, unspecified: Secondary | ICD-10-CM | POA: Diagnosis not present

## 2023-01-18 DIAGNOSIS — R7989 Other specified abnormal findings of blood chemistry: Secondary | ICD-10-CM | POA: Diagnosis not present

## 2023-01-18 DIAGNOSIS — Z0001 Encounter for general adult medical examination with abnormal findings: Secondary | ICD-10-CM

## 2023-01-18 DIAGNOSIS — D696 Thrombocytopenia, unspecified: Secondary | ICD-10-CM | POA: Insufficient documentation

## 2023-01-18 LAB — CBC WITH DIFFERENTIAL/PLATELET
Basophils Absolute: 0 10*3/uL (ref 0.0–0.1)
Basophils Relative: 0.6 % (ref 0.0–3.0)
Eosinophils Absolute: 0.1 10*3/uL (ref 0.0–0.7)
Eosinophils Relative: 1.7 % (ref 0.0–5.0)
HCT: 48.2 % (ref 39.0–52.0)
Hemoglobin: 16.4 g/dL (ref 13.0–17.0)
Lymphocytes Relative: 34.2 % (ref 12.0–46.0)
Lymphs Abs: 1.6 10*3/uL (ref 0.7–4.0)
MCHC: 34.1 g/dL (ref 30.0–36.0)
MCV: 93 fl (ref 78.0–100.0)
Monocytes Absolute: 0.3 10*3/uL (ref 0.1–1.0)
Monocytes Relative: 7.5 % (ref 3.0–12.0)
Neutro Abs: 2.6 10*3/uL (ref 1.4–7.7)
Neutrophils Relative %: 56 % (ref 43.0–77.0)
Platelets: 271 10*3/uL (ref 150.0–400.0)
RBC: 5.18 Mil/uL (ref 4.22–5.81)
RDW: 12.7 % (ref 11.5–15.5)
WBC: 4.6 10*3/uL (ref 4.0–10.5)

## 2023-01-18 LAB — HEPATIC FUNCTION PANEL
ALT: 13 U/L (ref 0–53)
AST: 11 U/L (ref 0–37)
Albumin: 5.1 g/dL (ref 3.5–5.2)
Alkaline Phosphatase: 59 U/L (ref 39–117)
Bilirubin, Direct: 0.2 mg/dL (ref 0.0–0.3)
Total Bilirubin: 1.1 mg/dL (ref 0.2–1.2)
Total Protein: 7.6 g/dL (ref 6.0–8.3)

## 2023-01-18 NOTE — Patient Instructions (Signed)
Thrombocytopenia Thrombocytopenia means that you have a low number of platelets in your blood. Platelets are tiny cells in the blood. When you bleed, they clump together at the cut or injury to stop the bleeding. This is called blood clotting. If you do not have enough platelets, your blood may have trouble clotting. This may cause you to bleed and bruise very easily. What are the causes? This condition is caused by a low number of platelets in your blood. There are three main reasons for this: Your body not making enough platelets. This may be caused by: Bone marrow diseases. Disorders that are passed from parent to child (inherited). Certain cancer medicines or treatments. Infection from germs (bacteria or viruses). Alcoholism. Platelets not being released in the blood. This can be caused by: Having a spleen that is larger than normal. A condition called Gaucher disease. Your body destroying platelets too quickly. This may be caused by: Certain autoimmune diseases. Some medicines that thin your blood. Certain blood clotting disorders. Certain bleeding disorders. Exposure to harmful (toxic) chemicals. Pregnancy. What are the signs or symptoms? Bruising easily. Bleeding from the nose or mouth. Heavy menstrual periods. Blood in the pee (urine), poop (stool), or vomit. A purple-red color to the skin (purpura). A rash that looks like pinpoint, purple-red spots (petechiae) on the lower legs. How is this treated? Treatment depends on the cause. Treatment may include: Treatment of another condition that is causing the low platelet count. Medicines to help protect your platelets from being destroyed. A replacement (transfusion) of platelets to stop or prevent bleeding. Surgery to take out the spleen. Follow these instructions at home: Medicines Take over-the-counter and prescription medicines only as told by your doctor. Do not take any medicines that have aspirin or NSAIDs, such as  ibuprofen. Activity Avoid doing things that could hurt or bruise you. Take action to prevent falls. Do not play contact sports. Ask your doctor what activities are safe for you. Take care not to burn yourself: When you use an iron. When you cook. Take care not to cut yourself: When you shave. When you use scissors, needles, knives, or other tools. General instructions  Check your skin and the inside of your mouth for bruises or blood as told by your doctor. Wear a medical alert bracelet that says that you have a bleeding disorder. Check to see if there is blood in your pee and poop. Do this as told by your doctor. Do not drink alcohol. If you do drink, limit the amount that you drink. Stay away from harmful (toxic) chemicals. Tell all of your doctors that you have this condition. Be sure to tell your dentist and eye doctor. Tell your dentist about your condition before you have your teeth cleaned. Keep all follow-up visits. Contact a doctor if: You have bruises and you do not know why. You have new symptoms. You have symptoms that get worse. You have a fever. Get help right away if: You have very bad bleeding anywhere on your body. You have blood in your vomit, pee, or poop. You have an injury to your head. You have a sudden, very bad headache. Summary Thrombocytopenia means that you have a low number of platelets in your blood. Platelets stick together to form a clot. Symptoms of this condition include getting bruises easily, bleeding from the mouth and nose, a purple-red color to the skin, and a rash. Take care not to cut or burn yourself. This information is not intended to replace advice given   to you by your health care provider. Make sure you discuss any questions you have with your health care provider. Document Revised: 03/27/2021 Document Reviewed: 03/27/2021 Elsevier Patient Education  2023 [employer]  

## 2023-01-18 NOTE — Progress Notes (Unsigned)
   Subjective:  Patient ID: Edward Randall, male    DOB: [birth year]  Age: 32 y.o. MRN: [id number]  CC: No chief complaint on file.   HPI Edward Randall presents for ***  Outpatient Medications Prior to Visit  Medication Sig Dispense Refill   amphetamine-dextroamphetamine (ADDERALL) 10 MG tablet Take 1 tablet (10 mg total) by mouth 2 (two) times daily with a meal. 60 tablet 0   No facility-administered medications prior to visit.    ROS Review of Systems  Objective:  There were no vitals taken for this visit.  BP Readings from Last 3 Encounters:  07/08/22 126/76  03/30/22 114/68  12/29/21 114/70    Wt Readings from Last 3 Encounters:  07/08/22 153 lb (69.4 kg)  03/30/22 143 lb (64.9 kg)  12/29/21 144 lb (65.3 kg)    Physical Exam  Lab Results  Component Value Date   WBC 8.9 07/08/2022   HGB 15.3 07/08/2022   HCT 44.1 07/08/2022   PLT 131.0 (L) 07/08/2022   GLUCOSE 86 12/29/2021   CHOL 137 09/23/2015   TRIG 93.0 09/23/2015   HDL 45.00 09/23/2015   LDLCALC 74 09/23/2015   ALT 56 (H) 07/08/2022   AST 46 (H) 07/08/2022   NA 139 12/29/2021   K 4.1 12/29/2021   CL 101 12/29/2021   CREATININE 0.84 12/29/2021   BUN 19 12/29/2021   CO2 27 12/29/2021   TSH 1.50 12/29/2021    CT Angio Neck W and/or Wo Contrast  Result Date: 04/09/2017 CLINICAL DATA:  Initial evaluation for acute vertigo, concern for possible dissection flap in left neck on prior ultrasound. EXAM: CT ANGIOGRAPHY NECK TECHNIQUE: Multidetector CT imaging of the neck was performed using the standard protocol during bolus administration of intravenous contrast. Multiplanar CT image reconstructions and MIPs were obtained to evaluate the vascular anatomy. Carotid stenosis measurements (when applicable) are obtained utilizing NASCET criteria, using the distal internal carotid diameter as the denominator. CONTRAST:  50 cc of Isovue 370. COMPARISON:  None available. FINDINGS: Aortic arch: Visualized aortic arch is  normal in caliber and appearance with normal 3 vessel morphology. No flow-limiting stenosis about the origin of the great vessels. Visualized subclavian arteries widely patent. Right carotid system: Right common and internal carotid arteries are widely patent without stenosis, dissection, intraluminal thrombus, or occlusion. No significant atheromatous narrowing about the right carotid bifurcation. Right external carotid artery and its branches within normal limits. Left carotid system: Left common and internal carotid artery's widely patent without stenosis, dissection, intraluminal thrombus, or occlusion. No significant atheromatous narrowing about the left carotid bifurcation. Left external carotid artery and its branches within normal limits. Vertebral arteries: Both of the vertebral arteries arise from the subclavian arteries. Vertebral arteries widely patent without stenosis, dissection, or occlusion. Skeleton: No acute osseus abnormality. No worrisome lytic or blastic osseous lesions. Other neck: No acute soft tissue abnormality within the neck. Salivary glands normal. No adenopathy. Thyroid normal. Upper chest: Visualized upper chest within normal limits. Visualized lungs are clear. IMPRESSION: Normal CTA of the neck. No evidence for dissection or other acute vascular abnormality. Electronically Signed   By: Jeannine Boga M.D.   On: 04/09/2017 19:35    Assessment & Plan:  There are no diagnoses linked to this encounter.   Follow-up: No follow-ups on file.  Scarlette Calico, MD

## 2023-01-19 ENCOUNTER — Encounter: Payer: Self-pay | Admitting: Internal Medicine

## 2023-01-19 LAB — GLIADIN ANTIBODIES, SERUM
Gliadin IgA: <1 U/mL
Gliadin IgG: <1 U/mL

## 2023-01-19 LAB — FOLATE: Folate: >23.8 ng/mL (ref >5.9)

## 2023-01-19 LAB — TISSUE TRANSGLUTAMINASE, IGA: (tTG) Ab, IgA: <1 U/mL

## 2023-01-19 LAB — VITAMIN B12: Vitamin B-12: 828 pg/mL (ref 211–911)

## 2023-01-20 LAB — RETICULIN ANTIBODIES, IGA W TITER: Reticulin Ab, IgA: NEGATIVE titer (ref <2.5)

## 2023-01-21 ENCOUNTER — Encounter: Payer: Self-pay | Admitting: Internal Medicine

## 2023-01-27 ENCOUNTER — Other Ambulatory Visit: Payer: Self-pay | Admitting: Internal Medicine

## 2023-01-27 DIAGNOSIS — F900 Attention-deficit hyperactivity disorder, predominantly inattentive type: Secondary | ICD-10-CM

## 2023-01-27 MED ORDER — AMPHETAMINE-DEXTROAMPHETAMINE 10 MG PO TABS
10.0000 mg | ORAL_TABLET | Freq: Two times a day (BID) | ORAL | 0 refills | Status: DC
Start: 2023-01-27 — End: 2023-02-23

## 2023-02-23 ENCOUNTER — Other Ambulatory Visit: Payer: Self-pay | Admitting: Internal Medicine

## 2023-02-23 DIAGNOSIS — F900 Attention-deficit hyperactivity disorder, predominantly inattentive type: Secondary | ICD-10-CM

## 2023-02-23 MED ORDER — AMPHETAMINE-DEXTROAMPHETAMINE 10 MG PO TABS: 10.0000 mg | ORAL_TABLET | Freq: Two times a day (BID) | ORAL | 0 refills | Status: DC

## 2023-03-20 ENCOUNTER — Other Ambulatory Visit: Payer: Self-pay | Admitting: Internal Medicine

## 2023-03-20 DIAGNOSIS — F900 Attention-deficit hyperactivity disorder, predominantly inattentive type: Secondary | ICD-10-CM

## 2023-03-23 MED ORDER — AMPHETAMINE-DEXTROAMPHETAMINE 10 MG PO TABS
10.0000 mg | ORAL_TABLET | Freq: Two times a day (BID) | ORAL | 0 refills | Status: DC
Start: 2023-03-23 — End: 2023-05-25

## 2023-05-25 ENCOUNTER — Other Ambulatory Visit: Payer: Self-pay | Admitting: Internal Medicine

## 2023-05-25 DIAGNOSIS — F900 Attention-deficit hyperactivity disorder, predominantly inattentive type: Secondary | ICD-10-CM

## 2023-05-25 MED ORDER — AMPHETAMINE-DEXTROAMPHETAMINE 10 MG PO TABS
10.0000 mg | ORAL_TABLET | Freq: Two times a day (BID) | ORAL | 0 refills | Status: DC
Start: 2023-05-25 — End: 2023-06-25

## 2023-06-25 ENCOUNTER — Other Ambulatory Visit: Payer: Self-pay | Admitting: Internal Medicine

## 2023-06-25 DIAGNOSIS — F900 Attention-deficit hyperactivity disorder, predominantly inattentive type: Secondary | ICD-10-CM

## 2023-06-29 MED ORDER — AMPHETAMINE-DEXTROAMPHETAMINE 10 MG PO TABS
10.0000 mg | ORAL_TABLET | Freq: Two times a day (BID) | ORAL | 0 refills | Status: DC
Start: 2023-06-29 — End: 2024-02-07

## 2023-08-25 ENCOUNTER — Other Ambulatory Visit: Payer: Self-pay | Admitting: Internal Medicine

## 2023-08-25 DIAGNOSIS — F900 Attention-deficit hyperactivity disorder, predominantly inattentive type: Secondary | ICD-10-CM

## 2023-09-27 ENCOUNTER — Ambulatory Visit: Payer: BC Managed Care – PPO | Admitting: Internal Medicine

## 2024-02-07 ENCOUNTER — Ambulatory Visit: Admitting: Internal Medicine

## 2024-02-07 VITALS — BP 130/60 | HR 80 | Temp 98.1°F | Resp 16 | Ht 70.0 in | Wt 145.4 lb

## 2024-02-07 DIAGNOSIS — F900 Attention-deficit hyperactivity disorder, predominantly inattentive type: Secondary | ICD-10-CM | POA: Diagnosis not present

## 2024-02-07 DIAGNOSIS — Z0000 Encounter for general adult medical examination without abnormal findings: Secondary | ICD-10-CM | POA: Diagnosis not present

## 2024-02-07 DIAGNOSIS — Z0001 Encounter for general adult medical examination with abnormal findings: Secondary | ICD-10-CM

## 2024-02-07 DIAGNOSIS — R300 Dysuria: Secondary | ICD-10-CM | POA: Diagnosis not present

## 2024-02-07 LAB — HEPATIC FUNCTION PANEL
ALT: 14 U/L (ref 0–53)
AST: 14 U/L (ref 0–37)
Albumin: 4.9 g/dL (ref 3.5–5.2)
Alkaline Phosphatase: 54 U/L (ref 39–117)
Bilirubin, Direct: 0.1 mg/dL (ref 0.0–0.3)
Total Bilirubin: 0.3 mg/dL (ref 0.2–1.2)
Total Protein: 6.9 g/dL (ref 6.0–8.3)

## 2024-02-07 LAB — CBC WITH DIFFERENTIAL/PLATELET
Basophils Absolute: 0 10*3/uL (ref 0.0–0.1)
Basophils Relative: 0.5 % (ref 0.0–3.0)
Eosinophils Absolute: 0.2 10*3/uL (ref 0.0–0.7)
Eosinophils Relative: 2.9 % (ref 0.0–5.0)
HCT: 46.4 % (ref 39.0–52.0)
Hemoglobin: 15.4 g/dL (ref 13.0–17.0)
Lymphocytes Relative: 36.5 % (ref 12.0–46.0)
Lymphs Abs: 2.1 10*3/uL (ref 0.7–4.0)
MCHC: 33.2 g/dL (ref 30.0–36.0)
MCV: 95.1 fl (ref 78.0–100.0)
Monocytes Absolute: 0.5 10*3/uL (ref 0.1–1.0)
Monocytes Relative: 7.9 % (ref 3.0–12.0)
Neutro Abs: 3 10*3/uL (ref 1.4–7.7)
Neutrophils Relative %: 52.2 % (ref 43.0–77.0)
Platelets: 236 10*3/uL (ref 150.0–400.0)
RBC: 4.88 Mil/uL (ref 4.22–5.81)
RDW: 12.9 % (ref 11.5–15.5)
WBC: 5.8 10*3/uL (ref 4.0–10.5)

## 2024-02-07 LAB — BASIC METABOLIC PANEL WITH GFR
BUN: 20 mg/dL (ref 6–23)
CO2: 30 meq/L (ref 19–32)
Calcium: 9.4 mg/dL (ref 8.4–10.5)
Chloride: 102 meq/L (ref 96–112)
Creatinine, Ser: 0.96 mg/dL (ref 0.40–1.50)
GFR: 105.81 mL/min (ref >60.00)
Glucose, Bld: 91 mg/dL (ref 70–99)
Potassium: 4.2 meq/L (ref 3.5–5.1)
Sodium: 139 meq/L (ref 135–145)

## 2024-02-07 MED ORDER — ATOMOXETINE HCL 18 MG PO CAPS
18.0000 mg | ORAL_CAPSULE | Freq: Every day | ORAL | 0 refills | Status: DC
Start: 2024-02-07 — End: 2024-03-06

## 2024-02-07 NOTE — Progress Notes (Unsigned)
 Subjective:  Patient ID: Edward Randall, male    DOB: 12-17-1990  Age: 33 y.o. MRN: [id number]  CC: Annual Exam   HPI Edward Randall presents for a CPX and f/up ---  Outpatient Medications Prior to Visit  Medication Sig Dispense Refill   amphetamine-dextroamphetamine (ADDERALL) 10 MG tablet Take 1 tablet (10 mg total) by mouth 2 (two) times daily with a meal. 60 tablet 0   No facility-administered medications prior to visit.    ROS Review of Systems  Constitutional:  Positive for unexpected weight change (wt loss). Negative for appetite change, chills, diaphoresis and fatigue.  HENT: Negative.    Respiratory: Negative.  Negative for cough, chest tightness, shortness of breath and wheezing.   Cardiovascular:  Negative for chest pain, palpitations and leg swelling.  Gastrointestinal:  Negative for abdominal pain, constipation, diarrhea, nausea and vomiting.  Genitourinary:  Positive for dysuria and testicular pain. Negative for decreased urine volume, difficulty urinating, flank pain, frequency, genital sores, hematuria, penile discharge, penile pain, penile swelling, scrotal swelling and urgency.  Musculoskeletal: Negative.   Skin: Negative.   Neurological:  Negative for dizziness and weakness.  Hematological:  Negative for adenopathy. Does not bruise/bleed easily.  Psychiatric/Behavioral:  Positive for decreased concentration and dysphoric mood. Negative for confusion and sleep disturbance. The patient is nervous/anxious.     Objective:  BP 130/60 (BP Location: Left Arm, Patient Position: Sitting, Cuff Size: Small)   Pulse 80   Temp 98.1 F (36.7 C) (Oral)   Resp 16   Ht 5\' 10"  (1.778 m)   Wt 145 lb 6.4 oz (66 kg)   SpO2 99%   BMI 20.86 kg/m   BP Readings from Last 3 Encounters:  02/07/24 130/60  01/18/23 112/78  07/08/22 126/76    Wt Readings from Last 3 Encounters:  02/07/24 145 lb 6.4 oz (66 kg)  01/18/23 150 lb (68 kg)  07/08/22 153 lb (69.4 kg)    Physical  Exam Vitals reviewed.  Constitutional:      Appearance: Normal appearance.  HENT:     Nose: Nose normal.     Mouth/Throat:     Mouth: Mucous membranes are moist.  Eyes:     General: No scleral icterus.    Conjunctiva/sclera: Conjunctivae normal.  Cardiovascular:     Rate and Rhythm: Normal rate and regular rhythm.     Heart sounds: No murmur heard.    No friction rub. No gallop.  Pulmonary:     Effort: Pulmonary effort is normal.     Breath sounds: No stridor. No wheezing, rhonchi or rales.  Abdominal:     General: Abdomen is flat.     Palpations: There is no mass.     Tenderness: There is no abdominal tenderness. There is no guarding.     Hernia: No hernia is present. There is no hernia in the left inguinal area or right inguinal area.  Genitourinary:    Pubic Area: No rash.      Penis: Normal and circumcised.      Testes: Normal.        Right: Mass, tenderness, swelling, testicular hydrocele or varicocele not present. Right testis is descended. Cremasteric reflex is present.         Left: Mass, tenderness, swelling, testicular hydrocele or varicocele not present. Left testis is descended. Cremasteric reflex is present.      Epididymis:     Right: Normal. Not inflamed or enlarged. No mass or tenderness.     Left:  Not inflamed or enlarged. No mass or tenderness.  Musculoskeletal:        General: Normal range of motion.     Cervical back: Neck supple.     Right lower leg: No edema.     Left lower leg: No edema.  Lymphadenopathy:     Cervical: No cervical adenopathy.     Lower Body: No right inguinal adenopathy. No left inguinal adenopathy.  Skin:    General: Skin is dry.  Neurological:     General: No focal deficit present.     Mental Status: He is alert and oriented to person, place, and time. Mental status is at baseline.  Psychiatric:        Mood and Affect: Mood normal.        Behavior: Behavior normal.     Lab Results  Component Value Date   WBC 4.6 01/18/2023    HGB 16.4 01/18/2023   HCT 48.2 01/18/2023   PLT 271.0 01/18/2023   GLUCOSE 86 12/29/2021   CHOL 137 09/23/2015   TRIG 93.0 09/23/2015   HDL 45.00 09/23/2015   LDLCALC 74 09/23/2015   ALT 13 01/18/2023   AST 11 01/18/2023   NA 139 12/29/2021   K 4.1 12/29/2021   CL 101 12/29/2021   CREATININE 0.84 12/29/2021   BUN 19 12/29/2021   CO2 27 12/29/2021   TSH 1.50 12/29/2021    CT Angio Neck W and/or Wo Contrast Result Date: 04/09/2017 CLINICAL DATA:  Initial evaluation for acute vertigo, concern for possible dissection flap in left neck on prior ultrasound. EXAM: CT ANGIOGRAPHY NECK TECHNIQUE: Multidetector CT imaging of the neck was performed using the standard protocol during bolus administration of intravenous contrast. Multiplanar CT image reconstructions and MIPs were obtained to evaluate the vascular anatomy. Carotid stenosis measurements (when applicable) are obtained utilizing NASCET criteria, using the distal internal carotid diameter as the denominator. CONTRAST:  50 cc of Isovue 370. COMPARISON:  None available. FINDINGS: Aortic arch: Visualized aortic arch is normal in caliber and appearance with normal 3 vessel morphology. No flow-limiting stenosis about the origin of the great vessels. Visualized subclavian arteries widely patent. Right carotid system: Right common and internal carotid arteries are widely patent without stenosis, dissection, intraluminal thrombus, or occlusion. No significant atheromatous narrowing about the right carotid bifurcation. Right external carotid artery and its branches within normal limits. Left carotid system: Left common and internal carotid artery's widely patent without stenosis, dissection, intraluminal thrombus, or occlusion. No significant atheromatous narrowing about the left carotid bifurcation. Left external carotid artery and its branches within normal limits. Vertebral arteries: Both of the vertebral arteries arise from the subclavian arteries.  Vertebral arteries widely patent without stenosis, dissection, or occlusion. Skeleton: No acute osseus abnormality. No worrisome lytic or blastic osseous lesions. Other neck: No acute soft tissue abnormality within the neck. Salivary glands normal. No adenopathy. Thyroid normal. Upper chest: Visualized upper chest within normal limits. Visualized lungs are clear. IMPRESSION: Normal CTA of the neck. No evidence for dissection or other acute vascular abnormality. Electronically Signed   By: Rise Mu M.D.   On: 04/09/2017 19:35    Assessment & Plan:  Encounter for general adult medical examination with abnormal findings  Dysuria -     Basic metabolic panel with GFR; Future -     CBC with Differential/Platelet; Future -     Urinalysis, Routine w reflex microscopic; Future  ADHD (attention deficit hyperactivity disorder), inattentive type -     Basic metabolic panel  with GFR; Future -     CBC with Differential/Platelet; Future -     Hepatic function panel; Future -     Atomoxetine HCl; Take 1 capsule (18 mg total) by mouth daily.  Dispense: 30 capsule; Refill: 0     Follow-up: Return in about 6 months (around 08/08/2024).  Sandra Crouch, MD

## 2024-02-07 NOTE — Patient Instructions (Signed)
 Health Maintenance, Male  Adopting a healthy lifestyle and getting preventive care are important in promoting health and wellness. Ask your health care provider about:  The right schedule for you to have regular tests and exams.  Things you can do on your own to prevent diseases and keep yourself healthy.  What should I know about diet, weight, and exercise?  Eat a healthy diet    Eat a diet that includes plenty of vegetables, fruits, low-fat dairy products, and lean protein.  Do not eat a lot of foods that are high in solid fats, added sugars, or sodium.  Maintain a healthy weight  Body mass index (BMI) is a measurement that can be used to identify possible weight problems. It estimates body fat based on height and weight. Your health care provider can help determine your BMI and help you achieve or maintain a healthy weight.  Get regular exercise  Get regular exercise. This is one of the most important things you can do for your health. Most adults should:  Exercise for at least 150 minutes each week. The exercise should increase your heart rate and make you sweat (moderate-intensity exercise).  Do strengthening exercises at least twice a week. This is in addition to the moderate-intensity exercise.  Spend less time sitting. Even light physical activity can be beneficial.  Watch cholesterol and blood lipids  Have your blood tested for lipids and cholesterol at 31 years of age, then have this test every 5 years.  You may need to have your cholesterol levels checked more often if:  Your lipid or cholesterol levels are high.  You are older than 31 years of age.  You are at high risk for heart disease.  What should I know about cancer screening?  Many types of cancers can be detected early and may often be prevented. Depending on your health history and family history, you may need to have cancer screening at various ages. This may include screening for:  Colorectal cancer.  Prostate cancer.  Skin cancer.  Lung  cancer.  What should I know about heart disease, diabetes, and high blood pressure?  Blood pressure and heart disease  High blood pressure causes heart disease and increases the risk of stroke. This is more likely to develop in people who have high blood pressure readings or are overweight.  Talk with your health care provider about your target blood pressure readings.  Have your blood pressure checked:  Every 3-5 years if you are 9-95 years of age.  Every year if you are 85 years old or older.  If you are between the ages of 29 and 29 and are a current or former smoker, ask your health care provider if you should have a one-time screening for abdominal aortic aneurysm (AAA).  Diabetes  Have regular diabetes screenings. This checks your fasting blood sugar level. Have the screening done:  Once every three years after age 23 if you are at a normal weight and have a low risk for diabetes.  More often and at a younger age if you are overweight or have a high risk for diabetes.  What should I know about preventing infection?  Hepatitis B  If you have a higher risk for hepatitis B, you should be screened for this virus. Talk with your health care provider to find out if you are at risk for hepatitis B infection.  Hepatitis C  Blood testing is recommended for:  Everyone born from [birth year] through 1963.  Anyone  with known risk factors for hepatitis C.  Sexually transmitted infections (STIs)  You should be screened each year for STIs, including gonorrhea and chlamydia, if:  You are sexually active and are younger than 31 years of age.  You are older than 31 years of age and your health care provider tells you that you are at risk for this type of infection.  Your sexual activity has changed since you were last screened, and you are at increased risk for chlamydia or gonorrhea. Ask your health care provider if you are at risk.  Ask your health care provider about whether you are at high risk for HIV. Your health care provider  may recommend a prescription medicine to help prevent HIV infection. If you choose to take medicine to prevent HIV, you should first get tested for HIV. You should then be tested every 3 months for as long as you are taking the medicine.  Follow these instructions at home:  Alcohol use  Do not drink alcohol if your health care provider tells you not to drink.  If you drink alcohol:  Limit how much you have to 0-2 drinks a day.  Know how much alcohol is in your drink. In the [place] one drink equals one 12 oz bottle of beer (355 mL), one 5 oz glass of wine (148 mL), or one 1 oz glass of hard liquor (44 mL).  Lifestyle  Do not use any products that contain nicotine or tobacco. These products include cigarettes, chewing tobacco, and vaping devices, such as e-cigarettes. If you need help quitting, ask your health care provider.  Do not use street drugs.  Do not share needles.  Ask your health care provider for help if you need support or information about quitting drugs.  General instructions  Schedule regular health, dental, and eye exams.  Stay current with your vaccines.  Tell your health care provider if:  You often feel depressed.  You have ever been abused or do not feel safe at home.  Summary  Adopting a healthy lifestyle and getting preventive care are important in promoting health and wellness.  Follow your health care provider's instructions about healthy diet, exercising, and getting tested or screened for diseases.  Follow your health care provider's instructions on monitoring your cholesterol and blood pressure.  This information is not intended to replace advice given to you by your health care provider. Make sure you discuss any questions you have with your health care provider.  Document Revised: 03/03/2021 Document Reviewed: 03/03/2021  Elsevier Patient Education  2024 [employer].

## 2024-02-08 ENCOUNTER — Encounter: Payer: Self-pay | Admitting: Internal Medicine

## 2024-02-08 LAB — URINALYSIS, ROUTINE W REFLEX MICROSCOPIC
Bilirubin Urine: NEGATIVE
Hgb urine dipstick: NEGATIVE
Ketones, ur: NEGATIVE
Leukocytes,Ua: NEGATIVE
Nitrite: NEGATIVE
RBC / HPF: NONE SEEN (ref 0–?)
Specific Gravity, Urine: 1.02 (ref 1.000–1.030)
Total Protein, Urine: NEGATIVE
Urine Glucose: NEGATIVE
Urobilinogen, UA: 0.2 (ref 0.0–1.0)
pH: 7.5 (ref 5.0–8.0)

## 2024-02-21 ENCOUNTER — Encounter: Admitting: Internal Medicine

## 2024-03-01 ENCOUNTER — Other Ambulatory Visit: Payer: Self-pay | Admitting: Internal Medicine

## 2024-03-01 DIAGNOSIS — F900 Attention-deficit hyperactivity disorder, predominantly inattentive type: Secondary | ICD-10-CM

## 2024-03-06 ENCOUNTER — Other Ambulatory Visit: Payer: Self-pay | Admitting: Internal Medicine

## 2024-03-06 ENCOUNTER — Encounter: Payer: Self-pay | Admitting: Internal Medicine

## 2024-03-06 DIAGNOSIS — F900 Attention-deficit hyperactivity disorder, predominantly inattentive type: Secondary | ICD-10-CM

## 2024-03-06 MED ORDER — ATOMOXETINE HCL 25 MG PO CAPS: 25.0000 mg | ORAL_CAPSULE | Freq: Every day | ORAL | 0 refills | Status: AC

## 2024-03-13 ENCOUNTER — Encounter: Payer: Self-pay | Admitting: Internal Medicine
